# Patient Record
Sex: Female | Born: 1965 | Race: Black or African American | Hispanic: No | Marital: Single | Smoking: Former smoker
Health system: Southern US, Community
[De-identification: ages and names within clinical notes are randomized; demographics above are authoritative.]

## PROBLEM LIST (undated history)

## (undated) DIAGNOSIS — E039 Hypothyroidism, unspecified: Secondary | ICD-10-CM

## (undated) DIAGNOSIS — D649 Anemia, unspecified: Secondary | ICD-10-CM

## (undated) DIAGNOSIS — F329 Major depressive disorder, single episode, unspecified: Secondary | ICD-10-CM

## (undated) DIAGNOSIS — M1990 Unspecified osteoarthritis, unspecified site: Secondary | ICD-10-CM

## (undated) DIAGNOSIS — H539 Unspecified visual disturbance: Secondary | ICD-10-CM

## (undated) DIAGNOSIS — R413 Other amnesia: Secondary | ICD-10-CM

## (undated) DIAGNOSIS — G43909 Migraine, unspecified, not intractable, without status migrainosus: Secondary | ICD-10-CM

## (undated) DIAGNOSIS — F32A Depression, unspecified: Secondary | ICD-10-CM

## (undated) DIAGNOSIS — I256 Silent myocardial ischemia: Secondary | ICD-10-CM

## (undated) HISTORY — DX: Major depressive disorder, single episode, unspecified: F32.9

## (undated) HISTORY — DX: Other amnesia: R41.3

## (undated) HISTORY — DX: Unspecified visual disturbance: H53.9

## (undated) HISTORY — PX: TUBAL LIGATION: SHX77

## (undated) HISTORY — DX: Migraine, unspecified, not intractable, without status migrainosus: G43.909

## (undated) HISTORY — DX: Depression, unspecified: F32.A

---

## 2005-11-03 ENCOUNTER — Inpatient Hospital Stay: Payer: Self-pay | Admitting: Obstetrics and Gynecology

## 2005-11-14 ENCOUNTER — Emergency Department: Payer: Self-pay | Admitting: Emergency Medicine

## 2006-04-08 ENCOUNTER — Other Ambulatory Visit: Payer: Self-pay

## 2006-04-08 ENCOUNTER — Emergency Department: Payer: Self-pay | Admitting: Emergency Medicine

## 2006-04-13 ENCOUNTER — Ambulatory Visit: Payer: Self-pay | Admitting: Emergency Medicine

## 2007-06-15 ENCOUNTER — Ambulatory Visit: Payer: Self-pay | Admitting: Oncology

## 2008-12-21 ENCOUNTER — Ambulatory Visit: Payer: Self-pay | Admitting: Oncology

## 2009-01-02 LAB — CBC WITH DIFFERENTIAL (CANCER CENTER ONLY)
BASO#: 0 10*3/uL (ref 0.0–0.2)
HCT: 30.5 % — ABNORMAL LOW (ref 34.8–46.6)
HGB: 9.6 g/dL — ABNORMAL LOW (ref 11.6–15.9)
LYMPH#: 1.8 10*3/uL (ref 0.9–3.3)
MONO#: 0.2 10*3/uL (ref 0.1–0.9)
NEUT%: 50.8 % (ref 39.6–80.0)
RBC: 4.23 10*6/uL (ref 3.70–5.32)
WBC: 4.4 10*3/uL (ref 3.9–10.0)

## 2009-01-02 LAB — CMP (CANCER CENTER ONLY)
ALT(SGPT): 15 U/L (ref 10–47)
AST: 19 U/L (ref 11–38)
CO2: 27 mEq/L (ref 18–33)
Chloride: 104 mEq/L (ref 98–108)
Creat: 0.8 mg/dl (ref 0.6–1.2)
Sodium: 137 mEq/L (ref 128–145)
Total Bilirubin: 0.6 mg/dl (ref 0.20–1.60)
Total Protein: 8.1 g/dL (ref 6.4–8.1)

## 2009-01-02 LAB — PROTIME-INR (CHCC SATELLITE)
INR: 1.1 — ABNORMAL LOW (ref 2.0–3.5)
Protime: 13.2 Seconds (ref 10.6–13.4)

## 2009-01-04 LAB — RETICULOCYTES (CHCC): ABS Retic: 21.9 10*3/uL (ref 19.0–186.0)

## 2009-01-04 LAB — HEMOGLOBINOPATHY EVALUATION
Hemoglobin Other: 0 % (ref 0.0–0.0)
Hgb A2 Quant: 2.2 % (ref 2.2–3.2)
Hgb F Quant: 0 % (ref 0.0–2.0)

## 2009-01-04 LAB — VON WILLEBRAND PANEL
Factor-VIII Activity: 74 % (ref 50–150)
Ristocetin-Cofactor: 53 % (ref 50–150)

## 2009-01-04 LAB — IRON AND TIBC
%SAT: 5 % — ABNORMAL LOW (ref 20–55)
TIBC: 474 ug/dL — ABNORMAL HIGH (ref 250–470)

## 2009-01-04 LAB — FERRITIN: Ferritin: 5 ng/mL — ABNORMAL LOW (ref 10–291)

## 2009-01-04 LAB — APTT: aPTT: 28 seconds (ref 24–37)

## 2009-01-31 ENCOUNTER — Ambulatory Visit: Payer: Self-pay | Admitting: Oncology

## 2009-02-06 ENCOUNTER — Ambulatory Visit: Payer: Self-pay | Admitting: Oncology

## 2009-04-02 ENCOUNTER — Ambulatory Visit: Payer: Self-pay | Admitting: Oncology

## 2009-04-04 LAB — CBC WITH DIFFERENTIAL (CANCER CENTER ONLY)
BASO%: 0.5 % (ref 0.0–2.0)
HCT: 34.3 % — ABNORMAL LOW (ref 34.8–46.6)
LYMPH#: 1.6 10*3/uL (ref 0.9–3.3)
MONO#: 0.3 10*3/uL (ref 0.1–0.9)
Platelets: 214 10*3/uL (ref 145–400)
RDW: 16.8 % — ABNORMAL HIGH (ref 10.5–14.6)
WBC: 3.7 10*3/uL — ABNORMAL LOW (ref 3.9–10.0)

## 2009-04-04 LAB — IRON AND TIBC
%SAT: 9 % — ABNORMAL LOW (ref 20–55)
Iron: 31 ug/dL — ABNORMAL LOW (ref 42–145)
TIBC: 362 ug/dL (ref 250–470)

## 2009-04-04 LAB — FERRITIN: Ferritin: 20 ng/mL (ref 10–291)

## 2009-10-12 ENCOUNTER — Ambulatory Visit: Payer: Self-pay | Admitting: Oncology

## 2009-10-25 LAB — CMP (CANCER CENTER ONLY)
Albumin: 3.7 g/dL (ref 3.3–5.5)
BUN, Bld: 13 mg/dL (ref 7–22)
Calcium: 8.8 mg/dL (ref 8.0–10.3)
Chloride: 108 mEq/L (ref 98–108)
Glucose, Bld: 87 mg/dL (ref 73–118)
Potassium: 4 mEq/L (ref 3.3–4.7)
Sodium: 139 mEq/L (ref 128–145)
Total Protein: 7.6 g/dL (ref 6.4–8.1)

## 2009-10-25 LAB — IRON AND TIBC
Iron: 120 ug/dL (ref 42–145)
UIBC: 280 ug/dL

## 2009-10-25 LAB — CBC WITH DIFFERENTIAL (CANCER CENTER ONLY)
BASO#: 0 10*3/uL (ref 0.0–0.2)
Eosinophils Absolute: 0.2 10*3/uL (ref 0.0–0.5)
HCT: 35.4 % (ref 34.8–46.6)
MCH: 29.3 pg (ref 26.0–34.0)
MCHC: 32.6 g/dL (ref 32.0–36.0)
MCV: 90 fL (ref 81–101)
MONO#: 0.2 10*3/uL (ref 0.1–0.9)
RDW: 13.2 % (ref 10.5–14.6)

## 2009-11-13 ENCOUNTER — Ambulatory Visit: Payer: Self-pay | Admitting: Oncology

## 2011-04-01 ENCOUNTER — Ambulatory Visit: Payer: Self-pay | Admitting: Internal Medicine

## 2012-10-15 ENCOUNTER — Ambulatory Visit: Payer: Self-pay

## 2012-12-15 IMAGING — NM NM THYROID IMAGING W/ UPTAKE SINGLE (24 HR)
1 series · 3 of 3 positions shown · non-contrast
Comparison: none

REASON FOR EXAM: Hyperthyroidism
COMMENTS:

[Series 1000: (id) thyroid scan · 2.40mm/px · 3 of 3 slices shown]
[im 1/3  full-range]
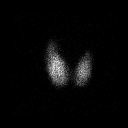
[im 2/3  full-range]
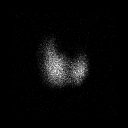
[im 3/3  full-range]
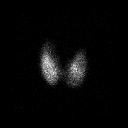

[3 of 3 positions shown; findings below may reference images not displayed]

PROCEDURE:     KNM - KNM THYROID 1-WNM 24HR [DATE]  [DATE]

RESULT:     Following oral administration of 144.9 uCi radioactive Iodine
1-WNM, the 6-hour thyroid uptake measured 78% and the 24-hour uptake
measured 82.2%. These values are in the hyperthyroid range.

Thyroid scan was performed in anterior and both oblique views. The right
lobe is slightly larger than the left. There is homogeneous tracer activity
in both lobes. No hot or cold nodules are identified.
IMPRESSION: 1. Thyroid uptake values are in the hyperthyroid range.
2. Essentially normal thyroid scan. The right lobe is slightly larger than
the left, but no hot or cold nodules are seen on either side.

## 2015-08-28 ENCOUNTER — Other Ambulatory Visit: Payer: Self-pay | Admitting: Internal Medicine

## 2015-08-28 DIAGNOSIS — Z1231 Encounter for screening mammogram for malignant neoplasm of breast: Secondary | ICD-10-CM

## 2015-08-29 ENCOUNTER — Ambulatory Visit
Admission: AD | Admit: 2015-08-29 | Discharge: 2015-08-29 | Disposition: A | Discharge status: Home / Self Care | Payer: Medicaid Other | Source: Ambulatory Visit | Attending: Internal Medicine | Admitting: Internal Medicine

## 2015-08-29 ENCOUNTER — Encounter: Payer: Self-pay | Admitting: *Deleted

## 2015-08-29 DIAGNOSIS — R002 Palpitations: Secondary | ICD-10-CM | POA: Diagnosis not present

## 2015-08-29 DIAGNOSIS — Z8249 Family history of ischemic heart disease and other diseases of the circulatory system: Secondary | ICD-10-CM | POA: Insufficient documentation

## 2015-08-29 DIAGNOSIS — R0602 Shortness of breath: Secondary | ICD-10-CM | POA: Insufficient documentation

## 2015-08-29 DIAGNOSIS — E039 Hypothyroidism, unspecified: Secondary | ICD-10-CM | POA: Insufficient documentation

## 2015-08-29 DIAGNOSIS — D649 Anemia, unspecified: Secondary | ICD-10-CM | POA: Diagnosis present

## 2015-08-29 DIAGNOSIS — D509 Iron deficiency anemia, unspecified: Secondary | ICD-10-CM | POA: Diagnosis present

## 2015-08-29 DIAGNOSIS — F17210 Nicotine dependence, cigarettes, uncomplicated: Secondary | ICD-10-CM | POA: Diagnosis not present

## 2015-08-29 DIAGNOSIS — N920 Excessive and frequent menstruation with regular cycle: Secondary | ICD-10-CM | POA: Diagnosis not present

## 2015-08-29 HISTORY — DX: Anemia, unspecified: D64.9

## 2015-08-29 HISTORY — DX: Hypothyroidism, unspecified: E03.9

## 2015-08-29 LAB — BASIC METABOLIC PANEL
ANION GAP: 5 (ref 5–15)
BUN: 13 mg/dL (ref 6–20)
CALCIUM: 8.8 mg/dL — AB (ref 8.9–10.3)
CHLORIDE: 109 mmol/L (ref 101–111)
CO2: 26 mmol/L (ref 22–32)
CREATININE: 0.64 mg/dL (ref 0.44–1.00)
GFR calc non Af Amer: >60 mL/min (ref >60)
Glucose, Bld: 84 mg/dL (ref 65–99)
Potassium: 3.8 mmol/L (ref 3.5–5.1)
SODIUM: 140 mmol/L (ref 135–145)

## 2015-08-29 LAB — CBC
HEMATOCRIT: 21.6 % — AB (ref 35.0–47.0)
HEMOGLOBIN: 5.9 g/dL — AB (ref 12.0–16.0)
MCH: 16.6 pg — ABNORMAL LOW (ref 26.0–34.0)
MCHC: 27.6 g/dL — ABNORMAL LOW (ref 32.0–36.0)
MCV: 60.1 fL — ABNORMAL LOW (ref 80.0–100.0)
Platelets: 348 10*3/uL (ref 150–440)
RBC: 3.59 MIL/uL — ABNORMAL LOW (ref 3.80–5.20)
RDW: 22 % — ABNORMAL HIGH (ref 11.5–14.5)
WBC: 3.4 10*3/uL — AB (ref 3.6–11.0)

## 2015-08-29 LAB — IRON AND TIBC
IRON: 11 ug/dL — AB (ref 28–170)
SATURATION RATIOS: 2 % — AB (ref 10.4–31.8)
TIBC: 537 ug/dL — AB (ref 250–450)
UIBC: 526 ug/dL

## 2015-08-29 LAB — ABO/RH: ABO/RH(D): B NEG

## 2015-08-29 LAB — TSH: TSH: 36.776 u[IU]/mL — AB (ref 0.350–4.500)

## 2015-08-29 LAB — PREPARE RBC (CROSSMATCH)

## 2015-08-29 LAB — HEMOGLOBIN: HEMOGLOBIN: 8.7 g/dL — AB (ref 12.0–16.0)

## 2015-08-29 MED ORDER — FERROUS SULFATE 325 (65 FE) MG PO TABS: 325.0000 mg | ORAL_TABLET | Freq: Two times a day (BID) | ORAL | Status: DC

## 2015-08-29 MED ORDER — SODIUM CHLORIDE 0.9 % IV SOLN
Freq: Once | INTRAVENOUS | Status: AC
  Administered 2015-08-29: 14:00:00 via INTRAVENOUS

## 2015-08-29 MED ORDER — LEVOTHYROXINE SODIUM 50 MCG PO TABS: 50.0000 ug | ORAL_TABLET | Freq: Every day | ORAL | Status: DC

## 2015-08-29 NOTE — H&P (Signed)
[place] [facility] [facility] [place] NAME: Katherine Brennan    MR#:  [id number]  DATE OF BIRTH:  [birth year]  DATE OF ADMISSION:  08/29/2015  PRIMARY CARE PHYSICIAN: Nino Glow McLean-Scocozza, MD   REQUESTING/REFERRING PHYSICIAN: Dr. Lamonte Sakai  CHIEF COMPLAINT: Low hemoglobin.  HISTORY OF PRESENT ILLNESS: Katherine Brennan  is a 49 y.o. female with a known history of anemia and hypothyroidism- was on medication in past but for last 2 years had not gone to any doctor and not taking any medications. For last 1 year her menstruation cycles had became more heavier and it is running for 5-6 days whereas up until last year her cycles were running only for 1-2 days and they're very light menstruation. She has been increasingly getting more short of breath and tired with exertion. She is able to continue doing her day-to-day work at home but if she has to walk a few more steps outside the house or for shopping and she gets very short of breath and she is to stop and catch her breath. Also once in a while she feels dizzy. Concerned with this she made an appointment with her doctor and yesterday C saw them they've done some blood test and sent her home. Today they got the results back and it was showing her having severe anemia so they called her to go to the hospital for blood transfusion. And called hospitalist team for this direct admission for transfusion. Denies any chest pain.   PAST MEDICAL HISTORY:   Past Medical History  Diagnosis Date  . Hypothyroidism   . Anemia     PAST SURGICAL HISTORY: History reviewed. No pertinent past surgical history.  SOCIAL HISTORY:  Social History  Substance Use Topics  . Smoking status: Light Tobacco Smoker    Types: Cigarettes  . Smokeless tobacco: Not on file  . Alcohol Use: Not on file    FAMILY HISTORY:  Family History  Problem Relation Age of Onset  . CAD Father     DRUG ALLERGIES: No Known Allergies  REVIEW OF  SYSTEMS:   CONSTITUTIONAL: No fever, positive for fatigue or weakness.  EYES: No blurred or double vision.  EARS, NOSE, AND THROAT: No tinnitus or ear pain.  RESPIRATORY: No cough, shortness of breath, wheezing or hemoptysis.  CARDIOVASCULAR: No chest pain, orthopnea, edema.  GASTROINTESTINAL: No nausea, vomiting, diarrhea or abdominal pain.  GENITOURINARY: No dysuria, hematuria.  ENDOCRINE: No polyuria, nocturia,  HEMATOLOGY: No anemia, easy bruising or bleeding SKIN: No rash or lesion. MUSCULOSKELETAL: No joint pain or arthritis.   NEUROLOGIC: No tingling, numbness,  Have generalized weakness.  PSYCHIATRY: No anxiety or depression.   MEDICATIONS AT HOME:  Prior to Admission medications   Not on File      PHYSICAL EXAMINATION:   VITAL SIGNS: Blood pressure 119/74, pulse 74, temperature 98.4 F (36.9 C), temperature source Oral, resp. rate 17, last menstrual period 08/26/2015, SpO2 100 %.  GENERAL:  49 y.o.-year-old patient lying in the bed with no acute distress.  EYES: Pupils equal, round, reactive to light and accommodation. No scleral icterus. Extraocular muscles intact.  HEENT: Head atraumatic, normocephalic. Oropharynx and nasopharynx clear. Conjunctiva pale. NECK:  Supple, no jugular venous distention. No thyroid enlargement, no tenderness.  LUNGS: Normal breath sounds bilaterally, no wheezing, rales,rhonchi or crepitation. No use of accessory muscles of respiration.  CARDIOVASCULAR: S1, S2 normal. No murmurs, rubs, or gallops.  ABDOMEN: Soft, nontender, nondistended. Bowel sounds present. No organomegaly or  mass.  EXTREMITIES: No pedal edema, cyanosis, or clubbing.  NEUROLOGIC: Cranial nerves II through XII are intact. Muscle strength 5/5 in all extremities. Sensation intact. Gait not checked.  PSYCHIATRIC: The patient is alert and oriented x 3.  SKIN: No obvious rash, lesion, or ulcer.   LABORATORY PANEL:   CBC  Recent Labs Lab 08/29/15 1100  WBC 3.4*  HGB 5.9*   HCT 21.6*  PLT 348  MCV 60.1*  MCH 16.6*  MCHC 27.6*  RDW 22.0*   ------------------------------------------------------------------------------------------------------------------  Chemistries   Recent Labs Lab 08/29/15 1100  NA 140  K 3.8  CL 109  CO2 26  GLUCOSE 84  BUN 13  CREATININE 0.64  CALCIUM 8.8*   ------------------------------------------------------------------------------------------------------------------ CrCl cannot be calculated (Unknown ideal weight.). ------------------------------------------------------------------------------------------------------------------ No results for input(s): TSH, T4TOTAL, T3FREE, THYROIDAB in the last 72 hours.  Invalid input(s): FREET3   Coagulation profile No results for input(s): INR, PROTIME in the last 168 hours. ------------------------------------------------------------------------------------------------------------------- No results for input(s): DDIMER in the last 72 hours. -------------------------------------------------------------------------------------------------------------------  Cardiac Enzymes No results for input(s): CKMB, TROPONINI, MYOGLOBIN in the last 168 hours.  Invalid input(s): CK ------------------------------------------------------------------------------------------------------------------ Invalid input(s): POCBNP  ---------------------------------------------------------------------------------------------------------------  Urinalysis No results found for: COLORURINE, APPEARANCEUR, LABSPEC, PHURINE, GLUCOSEU, HGBUR, BILIRUBINUR, KETONESUR, PROTEINUR, UROBILINOGEN, NITRITE, LEUKOCYTESUR   RADIOLOGY: No results found.  IMPRESSION AND PLAN:  * Symptomatic anemia  Microcide take anemia likely secondary to excessive menstruation blood loss.  Currently we will give 2 units of blood transfusion.  discussed with pt about possible side effects of blood transfusion- including more  common like low grade fever to rare and serious like renal and lung involvement and infections.  Pt understood- and due to necessity of transfusion- agreed to receive the transfusion.  I advised her to follow-up with her primary doctor in next few days and get a referral to GYN doctor to address this issue.  Meanwhile she will need to be discharged with oral iron therapy.  * Hypothyroidism  Patient says she was tested and was on some medications 2 years ago, but then since stopped taking everything and not going to the doctor. I'll check her TSH level and we will decide accordingly.   * Smoking  Tobacco cessation counseling was done 4 minutes.   All the records are reviewed and case discussed with ED provider. Management plans discussed with the patient, family and they are in agreement.  CODE STATUS:    Code Status Orders        Start     Ordered   08/29/15 1101  Full code   Continuous     08/29/15 1100       TOTAL TIME TAKING CARE OF THIS PATIENT: 50 minutes.    Vaughan Basta M.D on 08/29/2015   Between 7am to 6pm - Pager - [phone]  After 6pm go to [email or web address] - password [email or web address] [facility]  Office  [phone]  CC: Primary care physician; Nino Glow McLean-Scocozza, MD   Note: This dictation was prepared with Dragon dictation along with smaller phrase technology. Any transcriptional errors that result from this process are unintentional.

## 2015-08-29 NOTE — Progress Notes (Signed)
Patient's Hgb is 8.7, Vitals are WNL, no c/o SOB, pain or any discomfort. MD, Jannifer Franklin aware. MD will put in the order to discharge patient.

## 2015-08-29 NOTE — Discharge Instructions (Signed)
Follow with PMD in 1-2 weeks. °

## 2015-08-29 NOTE — Progress Notes (Signed)
MD paged about pts D/C for tonight. Ok to D/C pt if Hgb >7 post transfusion of 2units packed rbc.

## 2015-08-29 NOTE — Discharge Summary (Signed)
[facility] at [place] NAME: Katherine Brennan    MR#:  [id number]  DATE OF BIRTH:  Sep 05, 1966  DATE OF ADMISSION:  08/29/2015 ADMITTING PHYSICIAN: Loletha Grayer, MD  DATE OF DISCHARGE: 08/29/15  PRIMARY CARE PHYSICIAN: Nino Glow McLean-Scocozza, MD    ADMISSION DIAGNOSIS:  Anemia SOB Palpitations  DISCHARGE DIAGNOSIS:  Active Problems:   Symptomatic anemia   SECONDARY DIAGNOSIS:   Past Medical History  Diagnosis Date  . Hypothyroidism   . Anemia     HOSPITAL COURSE:  * Symptomatic anemia Microcide take anemia likely secondary to excessive menstruation blood loss.  given 2 units of blood transfusion. I advised her to follow-up with her primary doctor in next few days and get a referral to GYN doctor to address this issue. Meanwhile she will need to be discharged with oral iron therapy.  * Hypothyroidism Patient says she was tested and was on some medications 2 years ago, but then since stopped taking everything and not going to the doctor. TSH is 36- start on synthroid 50 mcg- and follow with PMD.    DISCHARGE CONDITIONS:   Stable.  CONSULTS OBTAINED:     DRUG ALLERGIES:  No Known Allergies  DISCHARGE MEDICATIONS:   Current Discharge Medication List    START taking these medications   Details  ferrous sulfate 325 (65 FE) MG tablet Take 1 tablet (325 mg total) by mouth 2 (two) times daily with a meal. Qty: 60 tablet, Refills: 0    levothyroxine (SYNTHROID) 50 MCG tablet Take 1 tablet (50 mcg total) by mouth daily before breakfast. Qty: 30 tablet, Refills: 0         DISCHARGE INSTRUCTIONS:    Follow with PMD in 1 week,. If you experience worsening of your admission symptoms, develop shortness of breath, life threatening emergency, suicidal or homicidal thoughts you must seek medical attention immediately by calling 911 or calling your MD immediately  if symptoms less severe.  You Must read  complete instructions/literature along with all the possible adverse reactions/side effects for all the Medicines you take and that have been prescribed to you. Take any new Medicines after you have completely understood and accept all the possible adverse reactions/side effects.   Please note  You were cared for by a hospitalist during your hospital stay. If you have any questions about your discharge medications or the care you received while you were in the hospital after you are discharged, you can call the unit and asked to speak with the hospitalist on call if the hospitalist that took care of you is not available. Once you are discharged, your primary care physician will handle any further medical issues. Please note that NO REFILLS for any discharge medications will be authorized once you are discharged, as it is imperative that you return to your primary care physician (or establish a relationship with a primary care physician if you do not have one) for your aftercare needs so that they can reassess your need for medications and monitor your lab values.    Today   CHIEF COMPLAINT:  No chief complaint on file.   HISTORY OF PRESENT ILLNESS:  Katherine Brennan  is a 49 y.o. female with a known history of anemia and hypothyroidism- was on medication in past but for last 2 years had not gone to any doctor and not taking any medications. For last 1 year her menstruation cycles had became more heavier and it is running for 5-6  days whereas up until last year her cycles were running only for 1-2 days and they're very light menstruation. She has been increasingly getting more short of breath and tired with exertion. She is able to continue doing her day-to-day work at home but if she has to walk a few more steps outside the house or for shopping and she gets very short of breath and she is to stop and catch her breath. Also once in a while she feels dizzy. Concerned with this she made an appointment with  her doctor and yesterday C saw them they've done some blood test and sent her home. Today they got the results back and it was showing her having severe anemia so they called her to go to the hospital for blood transfusion. And called hospitalist team for this direct admission for transfusion. Denies any chest pain.  VITAL SIGNS:  Blood pressure 118/81, pulse 71, temperature 98.4 F (36.9 C), temperature source Oral, resp. rate 17, last menstrual period 08/26/2015, SpO2 100 %.  I/O:   Intake/Output Summary (Last 24 hours) at 08/29/15 1850 Last data filed at 08/29/15 1700  Gross per 24 hour  Intake    800 ml  Output      0 ml  Net    800 ml    PHYSICAL EXAMINATION:   GENERAL: 49 y.o.-year-old patient lying in the bed with no acute distress.  EYES: Pupils equal, round, reactive to light and accommodation. No scleral icterus. Extraocular muscles intact.  HEENT: Head atraumatic, normocephalic. Oropharynx and nasopharynx clear. Conjunctiva pale. NECK: Supple, no jugular venous distention. No thyroid enlargement, no tenderness.  LUNGS: Normal breath sounds bilaterally, no wheezing, rales,rhonchi or crepitation. No use of accessory muscles of respiration.  CARDIOVASCULAR: S1, S2 normal. No murmurs, rubs, or gallops.  ABDOMEN: Soft, nontender, nondistended. Bowel sounds present. No organomegaly or mass.  EXTREMITIES: No pedal edema, cyanosis, or clubbing.  NEUROLOGIC: Cranial nerves II through XII are intact. Muscle strength 5/5 in all extremities. Sensation intact. Gait not checked.  PSYCHIATRIC: The patient is alert and oriented x 3.  SKIN: No obvious rash, lesion, or ulcer.    DATA REVIEW:   CBC  Recent Labs Lab 08/29/15 1100  WBC 3.4*  HGB 5.9*  HCT 21.6*  PLT 348    Chemistries   Recent Labs Lab 08/29/15 1100  NA 140  K 3.8  CL 109  CO2 26  GLUCOSE 84  BUN 13  CREATININE 0.64  CALCIUM 8.8*    Cardiac Enzymes No results for input(s): TROPONINI in the  last 168 hours.  Microbiology Results  No results found for this or any previous visit.  RADIOLOGY:  No results found.  EKG:   Orders placed or performed in visit on 04/08/06  . EKG 12-Lead      Management plans discussed with the patient, family and they are in agreement.  CODE STATUS:     Code Status Orders        Start     Ordered   08/29/15 1101  Full code   Continuous     08/29/15 1100      TOTAL TIME TAKING CARE OF THIS PATIENT: 35 minutes.    Vaughan Basta M.D on 08/29/2015 at 6:50 PM  Between 7am to 6pm - Pager - [phone]  After 6pm go to [email or web address] - password [email or web address] [facility]  Office  [phone]  CC: Primary care physician; Nino Glow McLean-Scocozza, MD   Note: This dictation was prepared  with Dragon dictation along with smaller phrase technology. Any transcriptional errors that result from this process are unintentional.

## 2015-08-30 LAB — TYPE AND SCREEN
ABO/RH(D): B NEG
ANTIBODY SCREEN: NEGATIVE
UNIT DIVISION: 0
UNIT DIVISION: 0

## 2015-09-06 ENCOUNTER — Ambulatory Visit
Admission: RE | Admit: 2015-09-06 | Discharge: 2015-09-06 | Disposition: A | Discharge status: Home / Self Care | Payer: Medicaid Other | Source: Ambulatory Visit | Attending: Internal Medicine | Admitting: Internal Medicine

## 2015-09-06 DIAGNOSIS — Z1231 Encounter for screening mammogram for malignant neoplasm of breast: Secondary | ICD-10-CM | POA: Insufficient documentation

## 2015-09-27 DIAGNOSIS — R768 Other specified abnormal immunological findings in serum: Secondary | ICD-10-CM | POA: Insufficient documentation

## 2015-09-27 DIAGNOSIS — R0609 Other forms of dyspnea: Secondary | ICD-10-CM

## 2015-09-27 DIAGNOSIS — R7689 Other specified abnormal immunological findings in serum: Secondary | ICD-10-CM | POA: Insufficient documentation

## 2015-09-27 DIAGNOSIS — M2550 Pain in unspecified joint: Secondary | ICD-10-CM | POA: Insufficient documentation

## 2015-09-27 DIAGNOSIS — R7982 Elevated C-reactive protein (CRP): Secondary | ICD-10-CM | POA: Insufficient documentation

## 2016-05-15 DIAGNOSIS — E039 Hypothyroidism, unspecified: Secondary | ICD-10-CM | POA: Insufficient documentation

## 2016-05-15 DIAGNOSIS — Z77120 Contact with and (suspected) exposure to mold (toxic): Secondary | ICD-10-CM | POA: Insufficient documentation

## 2016-05-15 DIAGNOSIS — E049 Nontoxic goiter, unspecified: Secondary | ICD-10-CM | POA: Insufficient documentation

## 2016-08-04 ENCOUNTER — Other Ambulatory Visit: Payer: Self-pay | Admitting: Internal Medicine

## 2016-08-04 ENCOUNTER — Ambulatory Visit
Admission: RE | Admit: 2016-08-04 | Discharge: 2016-08-04 | Disposition: A | Discharge status: Home / Self Care | Payer: Disability Insurance | Source: Ambulatory Visit | Attending: Internal Medicine | Admitting: Internal Medicine

## 2016-08-04 DIAGNOSIS — S3992XA Unspecified injury of lower back, initial encounter: Secondary | ICD-10-CM

## 2016-08-04 DIAGNOSIS — M2480 Other specific joint derangements of unspecified joint, not elsewhere classified: Secondary | ICD-10-CM | POA: Insufficient documentation

## 2016-08-04 DIAGNOSIS — X58XXXA Exposure to other specified factors, initial encounter: Secondary | ICD-10-CM | POA: Insufficient documentation

## 2016-10-14 DIAGNOSIS — R200 Anesthesia of skin: Secondary | ICD-10-CM | POA: Insufficient documentation

## 2016-10-14 DIAGNOSIS — G8929 Other chronic pain: Secondary | ICD-10-CM | POA: Insufficient documentation

## 2016-10-14 DIAGNOSIS — M25562 Pain in left knee: Secondary | ICD-10-CM

## 2016-10-15 ENCOUNTER — Other Ambulatory Visit: Payer: Self-pay | Admitting: Internal Medicine

## 2016-10-15 DIAGNOSIS — M25562 Pain in left knee: Secondary | ICD-10-CM

## 2016-10-23 ENCOUNTER — Ambulatory Visit
Admission: RE | Admit: 2016-10-23 | Discharge: 2016-10-23 | Disposition: A | Discharge status: Home / Self Care | Payer: Medicaid Other | Source: Ambulatory Visit | Attending: Internal Medicine | Admitting: Internal Medicine

## 2017-03-25 ENCOUNTER — Other Ambulatory Visit: Payer: Self-pay | Admitting: Family Medicine

## 2017-03-25 DIAGNOSIS — Z1239 Encounter for other screening for malignant neoplasm of breast: Secondary | ICD-10-CM

## 2017-04-15 ENCOUNTER — Inpatient Hospital Stay: Payer: Medicaid Other

## 2017-04-15 ENCOUNTER — Encounter: Payer: Self-pay | Admitting: Hematology and Oncology

## 2017-04-15 ENCOUNTER — Inpatient Hospital Stay: Payer: Medicaid Other | Attending: Hematology and Oncology | Admitting: Hematology and Oncology

## 2017-04-15 VITALS — BP 106/74 | HR 84 | Temp 97.8°F | Resp 18 | Ht 65.0 in | Wt 197.5 lb

## 2017-04-15 DIAGNOSIS — H539 Unspecified visual disturbance: Secondary | ICD-10-CM | POA: Diagnosis not present

## 2017-04-15 DIAGNOSIS — Z79899 Other long term (current) drug therapy: Secondary | ICD-10-CM | POA: Diagnosis not present

## 2017-04-15 DIAGNOSIS — D472 Monoclonal gammopathy: Secondary | ICD-10-CM | POA: Diagnosis not present

## 2017-04-15 DIAGNOSIS — F17210 Nicotine dependence, cigarettes, uncomplicated: Secondary | ICD-10-CM | POA: Insufficient documentation

## 2017-04-15 DIAGNOSIS — F413 Other mixed anxiety disorders: Secondary | ICD-10-CM | POA: Diagnosis not present

## 2017-04-15 DIAGNOSIS — R413 Other amnesia: Secondary | ICD-10-CM | POA: Diagnosis not present

## 2017-04-15 DIAGNOSIS — R233 Spontaneous ecchymoses: Secondary | ICD-10-CM

## 2017-04-15 DIAGNOSIS — E039 Hypothyroidism, unspecified: Secondary | ICD-10-CM | POA: Diagnosis not present

## 2017-04-15 DIAGNOSIS — R238 Other skin changes: Secondary | ICD-10-CM | POA: Insufficient documentation

## 2017-04-15 DIAGNOSIS — N920 Excessive and frequent menstruation with regular cycle: Secondary | ICD-10-CM | POA: Diagnosis not present

## 2017-04-15 DIAGNOSIS — D649 Anemia, unspecified: Secondary | ICD-10-CM

## 2017-04-15 DIAGNOSIS — F329 Major depressive disorder, single episode, unspecified: Secondary | ICD-10-CM | POA: Diagnosis not present

## 2017-04-15 DIAGNOSIS — R0602 Shortness of breath: Secondary | ICD-10-CM | POA: Diagnosis not present

## 2017-04-15 DIAGNOSIS — R42 Dizziness and giddiness: Secondary | ICD-10-CM

## 2017-04-15 DIAGNOSIS — R778 Other specified abnormalities of plasma proteins: Secondary | ICD-10-CM

## 2017-04-15 DIAGNOSIS — D509 Iron deficiency anemia, unspecified: Secondary | ICD-10-CM | POA: Diagnosis present

## 2017-04-15 LAB — CBC WITH DIFFERENTIAL/PLATELET
Basophils Absolute: 0 10*3/uL (ref 0–0.1)
Basophils Relative: 1 %
Eosinophils Absolute: 0.1 10*3/uL (ref 0–0.7)
Eosinophils Relative: 2 %
HCT: 28.6 % — ABNORMAL LOW (ref 35.0–47.0)
Hemoglobin: 9 g/dL — ABNORMAL LOW (ref 12.0–16.0)
Lymphocytes Relative: 37 %
Lymphs Abs: 1.6 10*3/uL (ref 1.0–3.6)
MCH: 25.2 pg — ABNORMAL LOW (ref 26.0–34.0)
MCHC: 31.6 g/dL — ABNORMAL LOW (ref 32.0–36.0)
MCV: 79.7 fL — ABNORMAL LOW (ref 80.0–100.0)
Monocytes Absolute: 0.3 10*3/uL (ref 0.2–0.9)
Monocytes Relative: 8 %
Neutro Abs: 2.3 10*3/uL (ref 1.4–6.5)
Neutrophils Relative %: 52 %
Platelets: 245 10*3/uL (ref 150–440)
RBC: 3.59 MIL/uL — ABNORMAL LOW (ref 3.80–5.20)
RDW: 17.5 % — ABNORMAL HIGH (ref 11.5–14.5)
WBC: 4.4 10*3/uL (ref 3.6–11.0)

## 2017-04-15 LAB — PLATELET FUNCTION ASSAY: Collagen / Epinephrine: 95 seconds (ref 0–193)

## 2017-04-15 LAB — PROTIME-INR
INR: 1
Prothrombin Time: 13.2 seconds (ref 11.4–15.2)

## 2017-04-15 LAB — FERRITIN: Ferritin: 4 ng/mL — ABNORMAL LOW (ref 11–307)

## 2017-04-15 NOTE — Progress Notes (Signed)
1 

## 2017-04-15 NOTE — Progress Notes (Signed)
[facility] day:  04/15/2017  Chief Complaint: Katherine Brennan is a 51 y.o. female with iron deficiency anemia who is referred in consultation by Dr. Salome Holmes for assessment and management.  HPI:  The patient notes a history of anemia for 2-3 years. She denies any melena or hematochezia. She has had no prior colonoscopy. She states her menses has been very heavy for 1 year. Menses last 7-8 days. She uses 24 tampons with pads. Sometimes she can become lightheaded and dizzy. She has seen gynecology in the past.  On 08/29/2015, she was admitted to [facility] with symptomatic anemia felt secondary to menorrhagia.  CBC revealed a hematocrit of 21.6, hemoglobin 5.9 and MCV 60.1. She was transfused with 2 units of PRBCs.  She was discharged on oral iron..  CBC on 09/27/2015 revealed a hematocrit 32.6, hemoglobin 9.3 and MCV 75.1. Ferritin was 5 with an iron saturation of 5% and a TIBC of 597.  Hemoglobin electrophoresis on 05/14/2016 was normal.  SPEP on 09/27/2015 revealed an M spike of 0.5 g/dL. Immunofixation revealed an IgG monoclonal protein with kappa light chain specificity.  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  IgG was 1901 [phone]) and IgA 514 ([phone]).  She has been on oral iron for about a year.  Ferrous sulfate was increased to 325 mg twice a day. She takes it with orange juice or vitamin C. Iron causes constipation. She received IV iron about 2 years ago at [facility]. She states infusions last for about 4 months and helped.   CBC on 03/25/2017 revealed a hematocrit of 31.6, hemoglobin 9.7, MCV 83, platelets 272,000 and white count 3600.  Creatinine was 0.7.  Ferritin was 5. Iron saturation was 4% and TIBC of 568.  TSH was 96.79 (high) with a free T4 of 0.45 (low).  Regarding her diet, she eats meat daily. She eats leafy vegetables 3 times a week. Ice pica resolved after being  on oral iron for 1 year. Previously she ate bags of ice. She notes unexplained bruising.  She denies any excess bleeding with surgery. She denies any use of aspirin or ibuprofen.  She has no family history of a bleeding diathesis.  Symptomatically, she feels pretty good. She does get tired walking to her car. She has shortness of breath with exertion. She denies any bone pain. She denies any issues with recurrent infections. He has had thyroid issues for approximately 3 years.    Past Medical History:  Diagnosis Date  . Anemia   . Depression   . Hypothyroidism   . Memory changes   . Migraines   . Vision changes     Past Surgical History:  Procedure Laterality Date  . [place], 2007    Family History  Problem Relation Age of Onset  . CAD Father   . Breast cancer Neg Hx     Social History:  reports that she has been smoking Cigarettes.  She does not have any smokeless tobacco history on file. Her alcohol and drug histories are not on file.  She drinks alcohol socially.  She is divorced.  She lives in [place].  The patient is alone today.  Allergies: No Known Allergies  Current Medications: Current Outpatient Prescriptions  Medication Sig Dispense Refill  . albuterol (PROVENTIL HFA;VENTOLIN HFA) 108 (90 Base) MCG/ACT inhaler Inhale 2 puffs into the lungs every 6 (six) hours as  needed.    . cetirizine (ZYRTEC) 10 MG tablet Take 10 mg by mouth daily.    . enalapril (VASOTEC) 2.5 MG tablet Take 2.5 mg by mouth daily.    . ferrous sulfate 325 (65 FE) MG tablet Take 1 tablet (325 mg total) by mouth 2 (two) times daily with a meal. 60 tablet 0  . levothyroxine (SYNTHROID) 50 MCG tablet Take 1 tablet (50 mcg total) by mouth daily before breakfast. 30 tablet 0  . levothyroxine (SYNTHROID, LEVOTHROID) 125 MCG tablet Take 125 mcg by mouth daily.    . meclizine (ANTIVERT) 12.5 MG tablet Take 12.5 mg by mouth 3 (three) times daily.    Marland Kitchen topiramate (TOPAMAX) 25 MG tablet  Take 25 mg by mouth 2 (two) times daily.     No current facility-administered medications for this visit.     Review of Systems:  GENERAL:  Feels "pretty good".  No fevers, sweats or weight loss. PERFORMANCE STATUS (ECOG):  1 HEENT:  Mole behind left eye (eye weak; sees dots).  No runny nose, sore throat, mouth sores or tenderness. Lungs: Shortness of breath with exertion.  No cough.  No hemoptysis. Cardiac:  No chest pain, palpitations, orthopnea, or PND. GI:  No nausea, vomiting, diarrhea, constipation, melena or hematochezia. GU:  Trouble holding urine s/p 3 C sections.  No urgency, frequency, dysuria, or hematuria. Musculoskeletal:  Legs hurt (left knee and hips hurt if sits too long".  No back pain.  No joint pain.  No muscle tenderness. Extremities:  No pain or swelling. Skin:  Unexplained bruise on shin.  No rashes or skin changes. Neuro:  Migraine headaches.  No numbness or weakness, balance or coordination issues. Endocrine:  No diabetes.  Hypothyroid on Synthroid.  No hot flashes or night sweats. Psych:  No mood changes, depression or anxiety. Pain:  No focal pain. Review of systems:  All other systems reviewed and found to be negative.  Physical Exam: Blood pressure 106/74, pulse 84, temperature 97.8 F (36.6 C), temperature source Tympanic, resp. rate 18, height 5\' 5"  (1.651 m), weight 197 lb 8.5 oz (89.6 kg). GENERAL:  Well developed, well nourished, sitting comfortably in the exam room in no acute distress. MENTAL STATUS:  Alert and oriented to person, place and time. HEAD:  Normocephalic, atraumatic, face symmetric, no Cushingoid features. EYES:  Pupils equal round and reactive to light and accomodation.  No conjunctivitis or scleral icterus. ENT: Right upper lip piercing.  Oropharynx clear without lesion.  Tongue normal. Mucous membranes moist.  RESPIRATORY:  Clear to auscultation without rales, wheezes or rhonchi. CARDIOVASCULAR:  Regular rate and rhythm without  murmur, rub or gallop. ABDOMEN:  Soft, non-tender, with active bowel sounds, and no hepatosplenomegaly.  No masses. SKIN:  Tattoo.  No petechiae.  No rashes, ulcers or lesions. EXTREMITIES: No edema, no skin discoloration or tenderness.  No palpable cords. LYMPH NODES: No palpable cervical, supraclavicular, axillary or inguinal adenopathy  NEUROLOGICAL: Unremarkable. PSYCH:  Appropriate.   Office Visit on 04/15/2017  Component Date Value Ref Range Status  . WBC 04/15/2017 4.4  3.6 - 11.0 K/uL Final  . RBC 04/15/2017 3.59* 3.80 - 5.20 MIL/uL Final  . Hemoglobin 04/15/2017 9.0* 12.0 - 16.0 g/dL Final  . HCT 04/15/2017 28.6* 35.0 - 47.0 % Final  . MCV 04/15/2017 79.7* 80.0 - 100.0 fL Final  . MCH 04/15/2017 25.2* 26.0 - 34.0 pg Final  . MCHC 04/15/2017 31.6* 32.0 - 36.0 g/dL Final  . RDW 04/15/2017 17.5* 11.5 -  14.5 % Final  . Platelets 04/15/2017 245  150 - 440 K/uL Final  . Neutrophils Relative % 04/15/2017 52  % Final  . Neutro Abs 04/15/2017 2.3  1.4 - 6.5 K/uL Final  . Lymphocytes Relative 04/15/2017 37  % Final  . Lymphs Abs 04/15/2017 1.6  1.0 - 3.6 K/uL Final  . Monocytes Relative 04/15/2017 8  % Final  . Monocytes Absolute 04/15/2017 0.3  0.2 - 0.9 K/uL Final  . Eosinophils Relative 04/15/2017 2  % Final  . Eosinophils Absolute 04/15/2017 0.1  0 - 0.7 K/uL Final  . Basophils Relative 04/15/2017 1  % Final  . Basophils Absolute 04/15/2017 0.0  0 - 0.1 K/uL Final  . Ferritin 04/15/2017 4* 11 - 307 ng/mL Final  . Prothrombin Time 04/15/2017 13.2  11.4 - 15.2 seconds Final  . INR 04/15/2017 1.00   Final  . Coagulation Factor VIII 04/15/2017 209* 57 - 163 % Final   Comment: (NOTE) FVIII activity can increase in a variety of clinical situations including normal pregnancy, in samples drawn from patients (particularly children) who are visibly stressed at the time of phlebotomy, as acute phase reactants, or in response to certain drug therapies such as DDAVP.  Persistently  elevated FVIII activity is a risk factor for venous thrombosis as well as recurrence of venous thrombosis.  Risk is graded and increases with the degree of elevation.  Although elevated FVIII activity has been identified to cluster within families, a genetic basis for the elevation has not yet been elucidated (Br J Haematol. 2012; 662:947-654).   . Ristocetin Co-factor, Plasma 04/15/2017 65  50 - 200 % Final   Comment: (NOTE) Performed At: [facility] [place], [place] [id number] Lindon Romp MD CL:[id number]   . Von Willebrand Antigen, Plasma 04/15/2017 136  50 - 200 % Final   Comment: (NOTE) This test was developed and its performance characteristics determined by [facility]. It has not been cleared or approved by the Food and Drug Administration.   Marland Kitchen PFA Interpretation 04/15/2017          Final   Comment: Platelet function is normal. If patient history/physical examination give strong indication of a bleeding disorder repeat testing for confirmation.        Results of the test should always be interpreted in conjunction with the patient's medical history, clinical presentation and medication history. Patients with Hematocrit values <35.0% or Platelet counts <150,000/uL may result in values above the Laboratory established reference range.   . Collagen / Epinephrine 04/15/2017 95  0 - 193 seconds Final   Performed at [facility], [address], [place], [place] [address]  . IgG (Immunoglobin G), Serum 04/15/2017 1713* 700 - 1,600 mg/dL Final  . IgA 04/15/2017 453* 87 - 352 mg/dL Final  . IgM, Serum 04/15/2017 70  26 - 217 mg/dL Final  . Total Protein ELP 04/15/2017 7.4  6.0 - 8.5 g/dL Corrected  . Albumin SerPl Elph-Mcnc 04/15/2017 3.6  2.9 - 4.4 g/dL Corrected  . Alpha 1 04/15/2017 0.2  0.0 - 0.4 g/dL Corrected  . Alpha2 Glob SerPl Elph-Mcnc 04/15/2017 0.6  0.4 - 1.0 g/dL Corrected  . B-Globulin SerPl Elph-Mcnc 04/15/2017 1.3  0.7 - 1.3 g/dL  Corrected  . Gamma Glob SerPl Elph-Mcnc 04/15/2017 1.7  0.4 - 1.8 g/dL Corrected  . M Protein SerPl Elph-Mcnc 04/15/2017 0.5* Not Observed g/dL Corrected  . Globulin, Total 04/15/2017 3.8  2.2 - 3.9 g/dL Corrected  . Albumin/Glob SerPl 04/15/2017  1.0  0.7 - 1.7 Corrected  . IFE 1 04/15/2017 Comment   Corrected   Comment: (NOTE) Immunofixation shows IgG monoclonal protein with kappa light chain specificity. An apparent polyclonal gammopathy: IgA. Kappa and lambda typing appear increased.   . Please Note 04/15/2017 Comment   Corrected   Comment: (NOTE) Protein electrophoresis scan will follow via computer, mail, or courier delivery. Performed At: [facility] [place], [place] [id number] Lindon Romp MD IN:[id number]   . Kappa free light chain 04/15/2017 30.4* 3.3 - 19.4 mg/L Final  . Lamda free light chains 04/15/2017 17.8  5.7 - 26.3 mg/L Final  . Kappa, lamda light chain ratio 04/15/2017 1.71* 0.26 - 1.65 Final   Comment: (NOTE) Performed At: [facility] [place], [place] [id number] Lindon Romp MD HU:[id number]   . Interpretation 04/15/2017 Note   Final   Comment: (NOTE) ------------------------------- COAGULATION: VON WILLEBRAND FACTOR ASSESSMENT CURRENT RESULTS ASSESSMENT The VWF:Ag is normal. The VWF:RCo is normal. The FVIII is elevated. VON WILLEBRAND FACTOR ASSESSMENT CURRENT RESULTS INTERPRETATION - These results are not consistent with a diagnosis of VWD according to the current NHLBI guideline. Persistently elevated FVIII activity is a risk factor for venous thrombosis as well as recurrence of venous thrombosis. Risk is graded and increases with the degree of elevation. Although elevated FVIII activity has been identified to cluster within families, a genetic basis for the elevation has not yet been elucidated (Br J Haematol. 2012; 157(6):653-663). VON WILLEBRAND FACTOR ASSESSMENT - Results may be falsely  elevated and possibly falsely normal as VWF and FVIII may increase in pregnancy, in samples drawn from patients (particularly children) who are visibly stressed at the time of phlebotomy, as acute phase reactants                          , or in response to certain drug therapies such as desmopressin. Repeat testing may be necessary before excluding a diagnosis of VWD especially if the clinical suspicion is high for an underlying bleeding disorder. The setting for phlebotomy should be as calm as possible and patients should be encouraged to sit quietly prior to the blood draw. VON WILLEBRAND FACTOR ASSESSMENT DEFINITIONS - VWD - von Willebrand disease; VWF - von Willebrand factor; VWF:Ag - VWF antigen; VWF:RCo - VWF ristocetin cofactor activity; FVIII - factor VIII activity. - MEDICAL DIRECTOR: For questions regarding panel interpretation, please contact Jake Bathe, M.D. at [facility] at [phone]. ------------------------------- DISCLAIMER These assessments and interpretations are provided as a convenience in support of the physician-patient relationship and are not intended to replace the physician's clinical judgment. They are derived from national guidelines in addit                          ion to other evidence and expert opinion. The clinician should consider this information within the context of clinical opinion and the individual patient. SEE GUIDANCE FOR VON WILLEBRAND FACTOR ASSESSMENT: (1) The National Heart, Lung and Blood Institute. The Diagnosis, Evaluation and Management of von Willebrand Disease. Janeal Holmes, MD: [employer] Publication 10-7492. 4967. Available at vSpecials.com.pt. (2) Daryl Eastern et al. Carmin Muskrat J Hematol. 2009; 84(6):366-370. (3) [place]. 2004;10(3):199-217. (4) Pasi KJ et al. Haemophilia. 2004; 10(3):218-231. Performed At: [facility] [place], [place] [id number] Thomasene Ripple MD ZL:[id number]     Assessment:  Katherine Cirilo  Brennan is a 51 y.o. female with iron deficiency anemia secondary to menorrhagia.  She denies any melena, hematochezia or hematuria.  Diet is good.  She has been on oral iron BID x 1 year.  She has a history of ice pica.  She received 2 units of PRBCs on 08/29/2015 for menorrhagia.  She has received IV iron 2 years ago at [facility].  CBC on 03/25/2017 revealed a hematocrit of 31.6, hemoglobin 9.7, and MCV 83.  Ferritin was 5. Iron saturation was 4% and TIBC of 568.  She has a history of a monoclonal gammopathy.  SPEP on 09/27/2015 revealed an M spike of 0.5 g/dL. Immunofixation revealed an IgG monoclonal protein with kappa light chain specificity.  IgG was 1901 [phone]) and IgA 514 (87-352).  She bruises easily.  She denies any excess bleeding with surgeries.  She does not take aspirin or ibuprofen.  She denies any family history of bleeding.  Symptomatically, she has shortness of breath with exertion.  Exam is unremarkable.  Plan: 1.  Discuss diagnosis of iron deficiency anemia due to menorrhagia.  Discuss work-up for bleeding diathesis.  Discuss low ferritin despite 1 year of oral iron.  Discuss reinstitution of IV iron.  Goal ferritin is 100. 2.  Discuss monoclonal gammopathy.  She denies any bone pain or recurrent infections.  Discuss rechecking SPEP.  If monoclonal gammopathy persists, discuss further work-up. 3.  Labs today: CBC with diff, ferritin, PT/INR, von Willebrand panel, platelet function assay, SPEP with immunoglobulins, free light chain assay. 4.  Guaiac cards x3. 5.  Preauth Venofer 6.  RTC in 1 week for review of labs and +/- Venofer   Lequita Asal, MD  04/15/2017, 3:42 PM

## 2017-04-15 NOTE — Progress Notes (Signed)
Patient here today as new evaluation regarding IDA/Hgb 9.7.  Referred by Dr. Iona Beard.

## 2017-04-17 LAB — VON WILLEBRAND PANEL
Coagulation Factor VIII: 209 % — ABNORMAL HIGH (ref 57–163)
Ristocetin Co-factor, Plasma: 65 % (ref 50–200)
Von Willebrand Antigen, Plasma: 136 % (ref 50–200)

## 2017-04-17 LAB — KAPPA/LAMBDA LIGHT CHAINS
Kappa free light chain: 30.4 mg/L — ABNORMAL HIGH (ref 3.3–19.4)
Kappa, lambda light chain ratio: 1.71 — ABNORMAL HIGH (ref 0.26–1.65)
Lambda free light chains: 17.8 mg/L (ref 5.7–26.3)

## 2017-04-17 LAB — COAG STUDIES INTERP REPORT

## 2017-04-18 LAB — MULTIPLE MYELOMA PANEL, SERUM
Albumin SerPl Elph-Mcnc: 3.6 g/dL (ref 2.9–4.4)
Albumin/Glob SerPl: 1 (ref 0.7–1.7)
Alpha 1: 0.2 g/dL (ref 0.0–0.4)
Alpha2 Glob SerPl Elph-Mcnc: 0.6 g/dL (ref 0.4–1.0)
B-Globulin SerPl Elph-Mcnc: 1.3 g/dL (ref 0.7–1.3)
Gamma Glob SerPl Elph-Mcnc: 1.7 g/dL (ref 0.4–1.8)
Globulin, Total: 3.8 g/dL (ref 2.2–3.9)
IgA: 453 mg/dL — ABNORMAL HIGH (ref 87–352)
IgG (Immunoglobin G), Serum: 1713 mg/dL — ABNORMAL HIGH (ref 700–1600)
IgM, Serum: 70 mg/dL (ref 26–217)
M Protein SerPl Elph-Mcnc: 0.5 g/dL — ABNORMAL HIGH
Total Protein ELP: 7.4 g/dL (ref 6.0–8.5)

## 2017-04-22 ENCOUNTER — Inpatient Hospital Stay: Payer: Medicaid Other

## 2017-04-22 ENCOUNTER — Inpatient Hospital Stay: Payer: Medicaid Other | Admitting: Hematology and Oncology

## 2017-04-22 ENCOUNTER — Encounter: Payer: Self-pay | Admitting: Hematology and Oncology

## 2017-04-22 DIAGNOSIS — D472 Monoclonal gammopathy: Secondary | ICD-10-CM | POA: Insufficient documentation

## 2017-04-22 DIAGNOSIS — D509 Iron deficiency anemia, unspecified: Secondary | ICD-10-CM | POA: Diagnosis not present

## 2017-04-22 NOTE — Progress Notes (Deleted)
[facility] day:  04/22/2017   Chief Complaint: Katherine Brennan is a 51 y.o. female with iron deficiency anemia who is seen for review of work-up and discussion regarding direction of therapy.  HPI:  The patient was last seen in the medical oncology clinic on 04/15/2017.  At that time, she was seen for initial consultation.  She has iron deficiency anemia secondary to heavy menses.  She also noted unexplained bruising.  She had received 2 units PRBCs in the past as well as IV iron.  She had been on oral iron x 1 year.  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  Work-up on 04/15/2017 revealed a hematocrit of 28.6, hemoglobin 9.0, MCV 79.7, platelets 245,000, white count 4400 with an ANC of 2300.  Ferritin was 4. PT was 13.2 with an INR of 1.  Von Willebrand panel and platelet function assay were normal.  SPEP revealed a 0.5 g/dL IgG monoclonal protein with kappa light chain specificity.  IgG was 1713 ([phone]).  IgA was 453 (87-352).  Kappa free light chains were 30.4, lambda free light chain 17.8 with a ratio of 1.71 (0.26-1.65).  During the interim,   Past Medical History:  Diagnosis Date  . Anemia   . Depression   . Hypothyroidism   . Memory changes   . Migraines   . Vision changes     Past Surgical History:  Procedure Laterality Date  . [place], 2007    Family History  Problem Relation Age of Onset  . CAD Father   . Breast cancer Neg Hx     Social History:  reports that she has been smoking Cigarettes.  She has never used smokeless tobacco. Her alcohol and drug histories are not on file.  She drinks alcohol socially.  She is divorced.  She lives in [place].  The patient is alone today.  Allergies: No Known Allergies  Current Medications: Current Outpatient Prescriptions  Medication Sig Dispense Refill  . albuterol (PROVENTIL HFA;VENTOLIN HFA) 108 (90 Base) MCG/ACT inhaler Inhale 2 puffs into the lungs  every 6 (six) hours as needed.    . cetirizine (ZYRTEC) 10 MG tablet Take 10 mg by mouth daily.    . enalapril (VASOTEC) 2.5 MG tablet Take 2.5 mg by mouth daily.    . ferrous sulfate 325 (65 FE) MG tablet Take 1 tablet (325 mg total) by mouth 2 (two) times daily with a meal. 60 tablet 0  . levothyroxine (SYNTHROID) 50 MCG tablet Take 1 tablet (50 mcg total) by mouth daily before breakfast. 30 tablet 0  . levothyroxine (SYNTHROID, LEVOTHROID) 125 MCG tablet Take 125 mcg by mouth daily.    . meclizine (ANTIVERT) 12.5 MG tablet Take 12.5 mg by mouth 3 (three) times daily.    Marland Kitchen topiramate (TOPAMAX) 25 MG tablet Take 25 mg by mouth 2 (two) times daily.     No current facility-administered medications for this visit.     Review of Systems:  GENERAL:  Feels "pretty good".  No fevers, sweats or weight loss. PERFORMANCE STATUS (ECOG):  1 HEENT:  Mole behind left eye (eye weak; sees dots).  No runny nose, sore throat, mouth sores or tenderness. Lungs: Shortness of breath with exertion.  No cough.  No hemoptysis. Cardiac:  No chest pain, palpitations, orthopnea, or PND. GI:  No nausea, vomiting, diarrhea, constipation, melena or hematochezia. GU:  Trouble holding urine s/p 3 C sections.  No urgency, frequency,  dysuria, or hematuria. Musculoskeletal:  Legs hurt (left knee and hips hurt if sits too long".  No back pain.  No joint pain.  No muscle tenderness. Extremities:  No pain or swelling. Skin:  Unexplained bruise on shin.  No rashes or skin changes. Neuro:  Migraine headaches.  No numbness or weakness, balance or coordination issues. Endocrine:  No diabetes.  Hypothyroid on Synthroid.  No hot flashes or night sweats. Psych:  No mood changes, depression or anxiety. Pain:  No focal pain. Review of systems:  All other systems reviewed and found to be negative.  Physical Exam: There were no vitals taken for this visit. GENERAL:  Well developed, well nourished, sitting comfortably in the exam room  in no acute distress. MENTAL STATUS:  Alert and oriented to person, place and time. HEAD:  Normocephalic, atraumatic, face symmetric, no Cushingoid features. EYES:  Pupils equal round and reactive to light and accomodation.  No conjunctivitis or scleral icterus. ENT: Right upper lip piercing.  Oropharynx clear without lesion.  Tongue normal. Mucous membranes moist.  RESPIRATORY:  Clear to auscultation without rales, wheezes or rhonchi. CARDIOVASCULAR:  Regular rate and rhythm without murmur, rub or gallop. ABDOMEN:  Soft, non-tender, with active bowel sounds, and no hepatosplenomegaly.  No masses. SKIN:  Tattoo.  No petechiae.  No rashes, ulcers or lesions. EXTREMITIES: No edema, no skin discoloration or tenderness.  No palpable cords. LYMPH NODES: No palpable cervical, supraclavicular, axillary or inguinal adenopathy  NEUROLOGICAL: Unremarkable. PSYCH:  Appropriate.   No visits with results within 3 Day(s) from this visit.  Latest known visit with results is:  Office Visit on 04/15/2017  Component Date Value Ref Range Status  . WBC 04/15/2017 4.4  3.6 - 11.0 K/uL Final  . RBC 04/15/2017 3.59* 3.80 - 5.20 MIL/uL Final  . Hemoglobin 04/15/2017 9.0* 12.0 - 16.0 g/dL Final  . HCT 04/15/2017 28.6* 35.0 - 47.0 % Final  . MCV 04/15/2017 79.7* 80.0 - 100.0 fL Final  . MCH 04/15/2017 25.2* 26.0 - 34.0 pg Final  . MCHC 04/15/2017 31.6* 32.0 - 36.0 g/dL Final  . RDW 04/15/2017 17.5* 11.5 - 14.5 % Final  . Platelets 04/15/2017 245  150 - 440 K/uL Final  . Neutrophils Relative % 04/15/2017 52  % Final  . Neutro Abs 04/15/2017 2.3  1.4 - 6.5 K/uL Final  . Lymphocytes Relative 04/15/2017 37  % Final  . Lymphs Abs 04/15/2017 1.6  1.0 - 3.6 K/uL Final  . Monocytes Relative 04/15/2017 8  % Final  . Monocytes Absolute 04/15/2017 0.3  0.2 - 0.9 K/uL Final  . Eosinophils Relative 04/15/2017 2  % Final  . Eosinophils Absolute 04/15/2017 0.1  0 - 0.7 K/uL Final  . Basophils Relative 04/15/2017 1  %  Final  . Basophils Absolute 04/15/2017 0.0  0 - 0.1 K/uL Final  . Ferritin 04/15/2017 4* 11 - 307 ng/mL Final  . Prothrombin Time 04/15/2017 13.2  11.4 - 15.2 seconds Final  . INR 04/15/2017 1.00   Final  . Coagulation Factor VIII 04/15/2017 209* 57 - 163 % Final   Comment: (NOTE) FVIII activity can increase in a variety of clinical situations including normal pregnancy, in samples drawn from patients (particularly children) who are visibly stressed at the time of phlebotomy, as acute phase reactants, or in response to certain drug therapies such as DDAVP.  Persistently elevated FVIII activity is a risk factor for venous thrombosis as well as recurrence of venous thrombosis.  Risk is graded and  increases with the degree of elevation.  Although elevated FVIII activity has been identified to cluster within families, a genetic basis for the elevation has not yet been elucidated (Br J Haematol. 2012; 188:416-606).   . Ristocetin Co-factor, Plasma 04/15/2017 65  50 - 200 % Final   Comment: (NOTE) Performed At: [facility] [place], [place] [id number] Lindon Romp MD AT:[id number]   . Von Willebrand Antigen, Plasma 04/15/2017 136  50 - 200 % Final   Comment: (NOTE) This test was developed and its performance characteristics determined by [facility]. It has not been cleared or approved by the Food and Drug Administration.   Marland Kitchen PFA Interpretation 04/15/2017          Final   Comment: Platelet function is normal. If patient history/physical examination give strong indication of a bleeding disorder repeat testing for confirmation.        Results of the test should always be interpreted in conjunction with the patient's medical history, clinical presentation and medication history. Patients with Hematocrit values <35.0% or Platelet counts <150,000/uL may result in values above the Laboratory established reference range.   . Collagen / Epinephrine 04/15/2017 95   0 - 193 seconds Final   Performed at [facility], [address] [place], [place] [address]  . IgG (Immunoglobin G), Serum 04/15/2017 1713* 700 - 1,600 mg/dL Final  . IgA 04/15/2017 453* 87 - 352 mg/dL Final  . IgM, Serum 04/15/2017 70  26 - 217 mg/dL Final  . Total Protein ELP 04/15/2017 7.4  6.0 - 8.5 g/dL Corrected  . Albumin SerPl Elph-Mcnc 04/15/2017 3.6  2.9 - 4.4 g/dL Corrected  . Alpha 1 04/15/2017 0.2  0.0 - 0.4 g/dL Corrected  . Alpha2 Glob SerPl Elph-Mcnc 04/15/2017 0.6  0.4 - 1.0 g/dL Corrected  . B-Globulin SerPl Elph-Mcnc 04/15/2017 1.3  0.7 - 1.3 g/dL Corrected  . Gamma Glob SerPl Elph-Mcnc 04/15/2017 1.7  0.4 - 1.8 g/dL Corrected  . M Protein SerPl Elph-Mcnc 04/15/2017 0.5* Not Observed g/dL Corrected  . Globulin, Total 04/15/2017 3.8  2.2 - 3.9 g/dL Corrected  . Albumin/Glob SerPl 04/15/2017 1.0  0.7 - 1.7 Corrected  . IFE 1 04/15/2017 Comment   Corrected   Comment: (NOTE) Immunofixation shows IgG monoclonal protein with kappa light chain specificity. An apparent polyclonal gammopathy: IgA. Kappa and lambda typing appear increased.   . Please Note 04/15/2017 Comment   Corrected   Comment: (NOTE) Protein electrophoresis scan will follow via computer, mail, or courier delivery. Performed At: [facility] [place], [place] [id number] Lindon Romp MD VO:[id number]   . Kappa free light chain 04/15/2017 30.4* 3.3 - 19.4 mg/L Final  . Lamda free light chains 04/15/2017 17.8  5.7 - 26.3 mg/L Final  . Kappa, lamda light chain ratio 04/15/2017 1.71* 0.26 - 1.65 Final   Comment: (NOTE) Performed At: [facility] [place], [place] [id number] Lindon Romp MD OJ:[id number]   . Interpretation 04/15/2017 Note   Final   Comment: (NOTE) ------------------------------- COAGULATION: VON WILLEBRAND FACTOR ASSESSMENT CURRENT RESULTS ASSESSMENT The VWF:Ag is normal. The VWF:RCo is normal. The FVIII is elevated. VON WILLEBRAND  FACTOR ASSESSMENT CURRENT RESULTS INTERPRETATION - These results are not consistent with a diagnosis of VWD according to the current NHLBI guideline. Persistently elevated FVIII activity is a risk factor for venous thrombosis as well as recurrence of venous thrombosis. Risk is graded and increases with the degree of elevation. Although elevated FVIII activity has  been identified to cluster within families, a genetic basis for the elevation has not yet been elucidated (Br J Haematol. 2012; 157(6):653-663). VON WILLEBRAND FACTOR ASSESSMENT - Results may be falsely elevated and possibly falsely normal as VWF and FVIII may increase in pregnancy, in samples drawn from patients (particularly children) who are visibly stressed at the time of phlebotomy, as acute phase reactants                          , or in response to certain drug therapies such as desmopressin. Repeat testing may be necessary before excluding a diagnosis of VWD especially if the clinical suspicion is high for an underlying bleeding disorder. The setting for phlebotomy should be as calm as possible and patients should be encouraged to sit quietly prior to the blood draw. VON WILLEBRAND FACTOR ASSESSMENT DEFINITIONS - VWD - von Willebrand disease; VWF - von Willebrand factor; VWF:Ag - VWF antigen; VWF:RCo - VWF ristocetin cofactor activity; FVIII - factor VIII activity. - MEDICAL DIRECTOR: For questions regarding panel interpretation, please contact Jake Bathe, M.D. at [facility] at [phone]. ------------------------------- DISCLAIMER These assessments and interpretations are provided as a convenience in support of the physician-patient relationship and are not intended to replace the physician's clinical judgment. They are derived from national guidelines in addit                          ion to other evidence and expert opinion. The clinician should consider this information within  the context of clinical opinion and the individual patient. SEE GUIDANCE FOR VON WILLEBRAND FACTOR ASSESSMENT: (1) The National Heart, Lung and Blood Institute. The Diagnosis, Evaluation and Management of von Willebrand Disease. Janeal Holmes, MD: [place] Publication 05-6760. 9509. Available at vSpecials.com.pt. (2) Daryl Eastern et al. [place] J Hematol. 2009; 84(6):366-370. (3) [place]. 2004;10(3):199-217. (4) Pasi KJ et al. Haemophilia. 2004; 10(3):218-231. Performed At: [facility] [place], [place] [id number] Thomasene Ripple MD [id number]     Assessment:  YAHAIRA BRUSKI is a 51 y.o. female with iron deficiency anemia secondary to menorrhagia.  She denies any melena, hematochezia or hematuria.  Diet is good.  She has been on oral iron BID x 1 year.  She has a history of ice pica.  She received 2 units of PRBCs on 08/29/2015 for menorrhagia.  She has received IV iron 2 years ago at [facility].  CBC on 03/25/2017 revealed a hematocrit of 31.6, hemoglobin 9.7, and MCV 83.  Ferritin was 5. Iron saturation was 4% and TIBC of 568.  She has a history of a monoclonal gammopathy.  SPEP on 09/27/2015 revealed an M spike of 0.5 g/dL. Immunofixation revealed an IgG monoclonal protein with kappa light chain specificity.  IgG was 1901 ([phone]) and IgA 514 (87-352).  She bruises easily.  She denies any excess bleeding with surgeries.  She does not take aspirin or ibuprofen.  She denies any family history of bleeding.  Symptomatically, she has shortness of breath with exertion.  Exam is unremarkable.  Plan: 1.  Labs today:  PTT, CMP, LDH, beta2-microglobulin. 2.  24 hour urine for UPEP and free light chains. 3.  Begin weekly Venofer x 4.  Ferritin goal 100. 4.  RTC in 6 weeks for labs (CBC with diff, ferritin- day before) +/- Venofer.  Discuss diagnosis of iron deficiency anemia due to  menorrhagia.  Discuss work-up for bleeding diathesis.  Discuss low ferritin despite 1 year of oral iron.  Discuss reinstitution of IV iron.  Goal ferritin is 100. 2.  Discuss monoclonal gammopathy.  She denies any bone pain or recurrent infections.  Discuss rechecking SPEP.  If monoclonal gammopathy persists, discuss further work-up. 3.  Labs today: CBC with diff, ferritin, PT/INR, von Willebrand panel, platelet function assay, SPEP with immunoglobulins, free light chain assay. 4.  Guaiac cards x3. 5.  Preauth Venofer 6.  RTC in 1 week for review of labs and +/- Venofer   Lequita Asal, MD  04/22/2017, 5:17 AM

## 2017-04-23 NOTE — Progress Notes (Unsigned)
PSN left patient a voice mail message, today, asking her to call to discuss her housing issues.

## 2017-04-24 DIAGNOSIS — D509 Iron deficiency anemia, unspecified: Secondary | ICD-10-CM | POA: Diagnosis not present

## 2017-04-29 ENCOUNTER — Inpatient Hospital Stay (HOSPITAL_BASED_OUTPATIENT_CLINIC_OR_DEPARTMENT_OTHER): Payer: Medicaid Other | Admitting: Hematology and Oncology

## 2017-04-29 ENCOUNTER — Encounter: Payer: Self-pay | Admitting: Hematology and Oncology

## 2017-04-29 ENCOUNTER — Inpatient Hospital Stay: Payer: Medicaid Other

## 2017-04-29 VITALS — BP 120/81 | HR 89 | Temp 98.2°F | Resp 18 | Wt 199.7 lb

## 2017-04-29 DIAGNOSIS — H539 Unspecified visual disturbance: Secondary | ICD-10-CM

## 2017-04-29 DIAGNOSIS — E039 Hypothyroidism, unspecified: Secondary | ICD-10-CM | POA: Diagnosis not present

## 2017-04-29 DIAGNOSIS — F329 Major depressive disorder, single episode, unspecified: Secondary | ICD-10-CM | POA: Diagnosis not present

## 2017-04-29 DIAGNOSIS — D472 Monoclonal gammopathy: Secondary | ICD-10-CM

## 2017-04-29 DIAGNOSIS — N920 Excessive and frequent menstruation with regular cycle: Secondary | ICD-10-CM

## 2017-04-29 DIAGNOSIS — R233 Spontaneous ecchymoses: Secondary | ICD-10-CM

## 2017-04-29 DIAGNOSIS — F413 Other mixed anxiety disorders: Secondary | ICD-10-CM

## 2017-04-29 DIAGNOSIS — F17210 Nicotine dependence, cigarettes, uncomplicated: Secondary | ICD-10-CM | POA: Diagnosis not present

## 2017-04-29 DIAGNOSIS — D500 Iron deficiency anemia secondary to blood loss (chronic): Secondary | ICD-10-CM

## 2017-04-29 DIAGNOSIS — R42 Dizziness and giddiness: Secondary | ICD-10-CM

## 2017-04-29 DIAGNOSIS — R0602 Shortness of breath: Secondary | ICD-10-CM

## 2017-04-29 DIAGNOSIS — R238 Other skin changes: Secondary | ICD-10-CM

## 2017-04-29 DIAGNOSIS — D649 Anemia, unspecified: Secondary | ICD-10-CM

## 2017-04-29 DIAGNOSIS — D509 Iron deficiency anemia, unspecified: Secondary | ICD-10-CM | POA: Diagnosis not present

## 2017-04-29 DIAGNOSIS — Z79899 Other long term (current) drug therapy: Secondary | ICD-10-CM

## 2017-04-29 LAB — COMPREHENSIVE METABOLIC PANEL
ALT: 10 U/L — ABNORMAL LOW (ref 14–54)
AST: 18 U/L (ref 15–41)
Albumin: 4.1 g/dL (ref 3.5–5.0)
Alkaline Phosphatase: 32 U/L — ABNORMAL LOW (ref 38–126)
Anion gap: 6 (ref 5–15)
BUN: 16 mg/dL (ref 6–20)
CO2: 26 mmol/L (ref 22–32)
Calcium: 8.9 mg/dL (ref 8.9–10.3)
Chloride: 103 mmol/L (ref 101–111)
Creatinine, Ser: 0.81 mg/dL (ref 0.44–1.00)
GFR calc Af Amer: >60 mL/min (ref >60)
GFR calc non Af Amer: >60 mL/min (ref >60)
Glucose, Bld: 113 mg/dL — ABNORMAL HIGH (ref 65–99)
Potassium: 3.7 mmol/L (ref 3.5–5.1)
Sodium: 135 mmol/L (ref 135–145)
Total Bilirubin: 0.6 mg/dL (ref 0.3–1.2)
Total Protein: 8.5 g/dL — ABNORMAL HIGH (ref 6.5–8.1)

## 2017-04-29 LAB — OCCULT BLOOD X 1 CARD TO LAB, STOOL
Fecal Occult Bld: NEGATIVE
Fecal Occult Bld: POSITIVE — AB
Fecal Occult Bld: NEGATIVE

## 2017-04-29 LAB — LACTATE DEHYDROGENASE: LDH: 138 U/L (ref 98–192)

## 2017-04-29 LAB — APTT: aPTT: 28 seconds (ref 24–36)

## 2017-04-29 MED ORDER — IRON SUCROSE 20 MG/ML IV SOLN
200.0000 mg | Freq: Once | INTRAVENOUS | Status: AC
  Administered 2017-04-29: 200 mg via INTRAVENOUS

## 2017-04-29 MED ORDER — SODIUM CHLORIDE 0.9 % IV SOLN
Freq: Once | INTRAVENOUS | Status: AC
  Administered 2017-04-29: 14:00:00 via INTRAVENOUS
  Filled 2017-04-29: qty 1000

## 2017-04-29 MED ORDER — SODIUM CHLORIDE 0.9 % IV SOLN: 200.0000 mg | Freq: Once | INTRAVENOUS | Status: DC

## 2017-04-29 NOTE — Progress Notes (Addendum)
[facility] day:  04/29/2017   Chief Complaint: Katherine Brennan is a 51 y.o. female with iron deficiency anemia who is seen for review of work-up and discussion regarding direction of therapy.  HPI:  The patient was last seen in the medical oncology clinic on 04/15/2017.  At that time, she was seen for initial consultation.  She had iron deficiency anemia secondary to heavy menses.  She also noted unexplained bruising.  She had received 2 units PRBCs in the past as well as IV iron.  She had been on oral iron x 1 year.  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  Work-up on 04/15/2017 revealed a hematocrit of 28.6, hemoglobin 9.0, MCV 79.7, platelets 245,000, white count 4400 with an ANC of 2300.  Ferritin was 4. PT was 13.2 with an INR of 1.  Von Willebrand panel and platelet function assay were normal.  SPEP revealed a 0.5 g/dL IgG monoclonal protein with kappa light chain specificity.  IgG was 1713 ([phone]).  IgA was 453 (87-352).  Kappa free light chains were 30.4, lambda free light chain 17.8 with a ratio of 1.71 (0.26-1.65).  During the interim, she has felt good.  She has continued on her oral iron.  She denies any melena or hematochezia.  One of 3 stools were guaiac positive.  She denies having her menses at that time. Her stool has not been solid for about a year. She would like to be seen by gastroenterology.   Past Medical History:  Diagnosis Date  . Anemia   . Depression   . Hypothyroidism   . Memory changes   . Migraines   . Vision changes     Past Surgical History:  Procedure Laterality Date  . [place], 2007    Family History  Problem Relation Age of Onset  . CAD Father   . Breast cancer Neg Hx     Social History:  reports that she has been smoking Cigarettes.  She has never used smokeless tobacco. Her alcohol and drug histories are not on file.  She drinks alcohol socially.  She is divorced.  She was  late to her clinic appointment today as she had to bring her child to the cardiologist.  She lives in [place].  The patient is alone today.  Allergies: No Known Allergies  Current Medications: Current Outpatient Prescriptions  Medication Sig Dispense Refill  . albuterol (PROVENTIL HFA;VENTOLIN HFA) 108 (90 Base) MCG/ACT inhaler Inhale 2 puffs into the lungs every 6 (six) hours as needed.    . cetirizine (ZYRTEC) 10 MG tablet Take 10 mg by mouth daily.    . enalapril (VASOTEC) 2.5 MG tablet Take 2.5 mg by mouth daily.    . ferrous sulfate 325 (65 FE) MG tablet Take 1 tablet (325 mg total) by mouth 2 (two) times daily with a meal. 60 tablet 0  . levothyroxine (SYNTHROID) 50 MCG tablet Take 1 tablet (50 mcg total) by mouth daily before breakfast. 30 tablet 0  . levothyroxine (SYNTHROID, LEVOTHROID) 125 MCG tablet Take 125 mcg by mouth daily.    . meclizine (ANTIVERT) 12.5 MG tablet Take 12.5 mg by mouth 3 (three) times daily.    Marland Kitchen topiramate (TOPAMAX) 25 MG tablet Take 25 mg by mouth 2 (two) times daily.     No current facility-administered medications for this visit.     Review of Systems:  GENERAL:  Feels "good".  No fevers or  sweats.  Weight up 2 pounds. PERFORMANCE STATUS (ECOG):  1 HEENT:  Mole behind left eye (eye weak; sees dots).  No runny nose, sore throat, mouth sores or tenderness. Lungs: Shortness of breath with exertion.  No cough.  No hemoptysis. Cardiac:  No chest pain, palpitations, orthopnea, or PND. GI:  Stool not solid x 1 year.  No nausea, vomiting, diarrhea, constipation, melena or hematochezia. GU:  Trouble holding urine s/p 3 C sections.  No urgency, frequency, dysuria, or hematuria. Musculoskeletal:  Legs hurt (left knee and hips hurt if sits too long).  No back pain.  No joint pain.  No muscle tenderness. Extremities:  No pain or swelling. Skin:  Unexplained bruises (chronic).  No rashes or skin changes. Neuro:  Migraine headaches.  No numbness or weakness, balance  or coordination issues. Endocrine:  No diabetes.  Hypothyroid on Synthroid.  No hot flashes or night sweats. Psych:  No mood changes, depression or anxiety. Pain:  No focal pain. Review of systems:  All other systems reviewed and found to be negative.  Physical Exam: Blood pressure 120/81, pulse 89, temperature 98.2 F (36.8 C), temperature source Tympanic, resp. rate 18, weight 199 lb 11.8 oz (90.6 kg). GENERAL:  Well developed, well nourished, woman sitting comfortably in the exam room in no acute distress. MENTAL STATUS:  Alert and oriented to person, place and time. HEAD:  Dark brown hair.  Normocephalic, atraumatic, face symmetric, no Cushingoid features. EYES:  Pupils equal round and reactive to light and accomodation.  No conjunctivitis or scleral icterus. ENT: Right upper lip piercing.  Oropharynx clear without lesion.  Tongue normal. Mucous membranes moist.  RESPIRATORY:  Clear to auscultation without rales, wheezes or rhonchi. CARDIOVASCULAR:  Regular rate and rhythm without murmur, rub or gallop. ABDOMEN:  Soft, non-tender, with active bowel sounds, and no hepatosplenomegaly.  No masses. SKIN:  Tattoo.  No petechiae.  No rashes, ulcers or lesions. EXTREMITIES: No edema, no skin discoloration or tenderness.  No palpable cords. LYMPH NODES: No palpable cervical, supraclavicular, axillary or inguinal adenopathy  NEUROLOGICAL: Unremarkable. PSYCH:  Appropriate.   Appointment on 04/29/2017  Component Date Value Ref Range Status  . Fecal Occult Bld 04/22/2017 NEGATIVE  NEGATIVE Final  . Fecal Occult Bld 04/24/2017 POSITIVE* NEGATIVE Final  . Fecal Occult Bld 04/29/2017 NEGATIVE  NEGATIVE Final  . Sodium 04/29/2017 135  135 - 145 mmol/L Final  . Potassium 04/29/2017 3.7  3.5 - 5.1 mmol/L Final  . Chloride 04/29/2017 103  101 - 111 mmol/L Final  . CO2 04/29/2017 26  22 - 32 mmol/L Final  . Glucose, Bld 04/29/2017 113* 65 - 99 mg/dL Final  . BUN 04/29/2017 16  6 - 20 mg/dL Final   . Creatinine, Ser 04/29/2017 0.81  0.44 - 1.00 mg/dL Final  . Calcium 04/29/2017 8.9  8.9 - 10.3 mg/dL Final  . Total Protein 04/29/2017 8.5* 6.5 - 8.1 g/dL Final  . Albumin 04/29/2017 4.1  3.5 - 5.0 g/dL Final  . AST 04/29/2017 18  15 - 41 U/L Final  . ALT 04/29/2017 10* 14 - 54 U/L Final  . Alkaline Phosphatase 04/29/2017 32* 38 - 126 U/L Final  . Total Bilirubin 04/29/2017 0.6  0.3 - 1.2 mg/dL Final  . GFR calc non Af Amer 04/29/2017 >60  >60 mL/min Final  . GFR calc Af Amer 04/29/2017 >60  >60 mL/min Final   Comment: (NOTE) The eGFR has been calculated using the CKD EPI equation. This calculation has not been validated  in all clinical situations. eGFR's persistently <60 mL/min signify possible Chronic Kidney Disease.   . Anion gap 04/29/2017 6  5 - 15 Final  . LDH 04/29/2017 138  98 - 192 U/L Final    Assessment:  JANEISHA RYLE is a 51 y.o. female with iron deficiency anemia secondary to menorrhagia.  She denies any melena, hematochezia or hematuria.  Diet is good.  She has been on oral iron BID x 1 year.  She has a history of ice pica.  She received 2 units of PRBCs on 08/29/2015 for menorrhagia.  She has received IV iron 2 years ago at [facility].  Work-up on 04/15/2017 revealed a hematocrit of 28.6, hemoglobin 9.0, MCV 79.7, platelets 245,000, white count 4400 with an ANC of 2300.  Ferritin was 4.  One of 3 cards were guaiac positive in 04/2017.  She has a history of a monoclonal gammopathy.  SPEP on 04/15/2017 revealed a 0.5 g/dL IgG monoclonal protein with kappa light chain specificity.  IgG was 1713 ([phone]).  Free light chains ratio was 1.71 (0.26-1.65).  SPEP on 09/27/2015 revealed an M spike of 0.5 g/dL.   She bruises easily.  She denies any excess bleeding with surgeries.  She does not take aspirin or ibuprofen.  She denies any family history of bleeding.  Work-up on 04/15/2017 revealed the following normal studies:  PT/INR, von Willebrand panel, platelet function  assay.  Symptomatically, she feels good.  Exam is unremarkable.  Plan: 1.  Labs today:  PTT, CMP, LDH, beta 2-microglobulin. 2.  Discuss labs from last visit.  Patient anemic (hematocrit 28.6) and ferritin low (4) after 1 year of oral iron.  Etiology felt secondary to menorrhagia.  One of 3 stools + occult blood.  Discuss referral to GI.  Patient in agreement. 3.  Discuss institution of Venofer to replete iron stores and improve hemoglobin.  Side effects reviewed.  Discuss initiation of Venofer x 4.  Oral iron will be discontinued.  Patient consented to treatment. 4.  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  Check 24 hour UPEP with free light chains. 5.  Consult gastroenterology:  Iron deficiency anemia; 1 of 3 guaiac + stools; change in stool quality. 6.  Begin weekly Venofer x 4.  Ferritin goal 100. 7.  Check CBC and ferritin in 2 weeks. 8.  RTC in 6 weeks for labs (CBC with diff, ferritin- day before) +/- Venofer.   Lequita Asal, MD  04/29/2017, 2:03 PM

## 2017-04-29 NOTE — Addendum Note (Signed)
Addended by: Nolon Stalls C on: 04/29/2017 05:46 PM   Modules accepted: Level of Service

## 2017-04-29 NOTE — Progress Notes (Signed)
Patient offers no complaints today. 

## 2017-04-29 NOTE — Patient Instructions (Signed)

## 2017-04-30 LAB — BETA 2 MICROGLOBULIN, SERUM: Beta-2 Microglobulin: 1.3 mg/L (ref 0.6–2.4)

## 2017-05-01 ENCOUNTER — Inpatient Hospital Stay: Payer: Medicaid Other

## 2017-05-01 DIAGNOSIS — R233 Spontaneous ecchymoses: Secondary | ICD-10-CM

## 2017-05-01 DIAGNOSIS — D472 Monoclonal gammopathy: Secondary | ICD-10-CM

## 2017-05-01 DIAGNOSIS — D500 Iron deficiency anemia secondary to blood loss (chronic): Secondary | ICD-10-CM

## 2017-05-01 DIAGNOSIS — R238 Other skin changes: Secondary | ICD-10-CM

## 2017-05-01 DIAGNOSIS — D509 Iron deficiency anemia, unspecified: Secondary | ICD-10-CM | POA: Diagnosis not present

## 2017-05-04 LAB — IFE+PROTEIN ELECTRO, 24-HR UR
% BETA, Urine: 28.1 %
ALPHA 1 URINE: 0.8 %
Albumin, U: 19.5 %
Alpha 2, Urine: 13.7 %
GAMMA GLOBULIN URINE: 38.1 %
Total Protein, Urine-Ur/day: 76 mg/24 hr (ref 30–150)
Total Protein, Urine: 10.8 mg/dL
Total Volume: 700

## 2017-05-06 ENCOUNTER — Inpatient Hospital Stay: Payer: Medicaid Other

## 2017-05-06 ENCOUNTER — Ambulatory Visit: Payer: Medicaid Other

## 2017-05-13 ENCOUNTER — Inpatient Hospital Stay: Payer: Medicaid Other

## 2017-05-13 ENCOUNTER — Inpatient Hospital Stay: Payer: Medicaid Other | Attending: Hematology and Oncology

## 2017-05-13 VITALS — BP 104/70 | HR 86 | Temp 97.4°F | Resp 18

## 2017-05-13 DIAGNOSIS — R238 Other skin changes: Secondary | ICD-10-CM

## 2017-05-13 DIAGNOSIS — Z79899 Other long term (current) drug therapy: Secondary | ICD-10-CM | POA: Diagnosis not present

## 2017-05-13 DIAGNOSIS — D500 Iron deficiency anemia secondary to blood loss (chronic): Secondary | ICD-10-CM | POA: Diagnosis not present

## 2017-05-13 DIAGNOSIS — R233 Spontaneous ecchymoses: Secondary | ICD-10-CM

## 2017-05-13 DIAGNOSIS — D472 Monoclonal gammopathy: Secondary | ICD-10-CM

## 2017-05-13 LAB — CBC WITH DIFFERENTIAL/PLATELET
Basophils Absolute: 0 10*3/uL (ref 0–0.1)
Basophils Relative: 1 %
Eosinophils Absolute: 0 10*3/uL (ref 0–0.7)
Eosinophils Relative: 1 %
HCT: 30.3 % — ABNORMAL LOW (ref 35.0–47.0)
Hemoglobin: 9.4 g/dL — ABNORMAL LOW (ref 12.0–16.0)
Lymphocytes Relative: 30 %
Lymphs Abs: 1.2 10*3/uL (ref 1.0–3.6)
MCH: 24.9 pg — ABNORMAL LOW (ref 26.0–34.0)
MCHC: 30.9 g/dL — ABNORMAL LOW (ref 32.0–36.0)
MCV: 80.6 fL (ref 80.0–100.0)
Monocytes Absolute: 0.2 10*3/uL (ref 0.2–0.9)
Monocytes Relative: 6 %
Neutro Abs: 2.4 10*3/uL (ref 1.4–6.5)
Neutrophils Relative %: 62 %
Platelets: 269 10*3/uL (ref 150–440)
RBC: 3.77 MIL/uL — ABNORMAL LOW (ref 3.80–5.20)
RDW: 18.6 % — ABNORMAL HIGH (ref 11.5–14.5)
WBC: 3.9 10*3/uL (ref 3.6–11.0)

## 2017-05-13 LAB — FERRITIN: Ferritin: 18 ng/mL (ref 11–307)

## 2017-05-13 MED ORDER — SODIUM CHLORIDE 0.9 % IV SOLN
Freq: Once | INTRAVENOUS | Status: AC
  Administered 2017-05-13: 14:00:00 via INTRAVENOUS
  Filled 2017-05-13: qty 1000

## 2017-05-13 MED ORDER — IRON SUCROSE 20 MG/ML IV SOLN
200.0000 mg | Freq: Once | INTRAVENOUS | Status: AC
  Administered 2017-05-13: 200 mg via INTRAVENOUS
  Filled 2017-05-13: qty 10

## 2017-05-13 MED ORDER — SODIUM CHLORIDE 0.9 % IV SOLN: 200.0000 mg | Freq: Once | INTRAVENOUS | Status: DC

## 2017-05-13 NOTE — Patient Instructions (Signed)

## 2017-05-20 ENCOUNTER — Inpatient Hospital Stay: Payer: Medicaid Other

## 2017-05-20 VITALS — BP 125/80 | HR 73 | Temp 97.1°F | Resp 18

## 2017-05-20 DIAGNOSIS — D500 Iron deficiency anemia secondary to blood loss (chronic): Secondary | ICD-10-CM

## 2017-05-20 MED ORDER — IRON SUCROSE 20 MG/ML IV SOLN
200.0000 mg | Freq: Once | INTRAVENOUS | Status: AC
  Administered 2017-05-20: 200 mg via INTRAVENOUS

## 2017-05-20 MED ORDER — SODIUM CHLORIDE 0.9 % IV SOLN: 200.0000 mg | Freq: Once | INTRAVENOUS | Status: DC

## 2017-05-20 MED ORDER — SODIUM CHLORIDE 0.9 % IV SOLN
Freq: Once | INTRAVENOUS | Status: AC
  Administered 2017-05-20: 10:00:00 via INTRAVENOUS
  Filled 2017-05-20: qty 1000

## 2017-05-20 MED ORDER — IRON SUCROSE 20 MG/ML IV SOLN
INTRAVENOUS | Status: AC
  Filled 2017-05-20: qty 10

## 2017-05-27 ENCOUNTER — Ambulatory Visit: Payer: Medicaid Other

## 2017-05-27 ENCOUNTER — Inpatient Hospital Stay: Payer: Medicaid Other

## 2017-05-27 VITALS — BP 114/78 | HR 75 | Temp 97.2°F | Resp 18

## 2017-05-27 DIAGNOSIS — D500 Iron deficiency anemia secondary to blood loss (chronic): Secondary | ICD-10-CM

## 2017-05-27 MED ORDER — SODIUM CHLORIDE 0.9 % IV SOLN
Freq: Once | INTRAVENOUS | Status: AC
  Administered 2017-05-27: 11:00:00 via INTRAVENOUS
  Filled 2017-05-27: qty 1000

## 2017-05-27 MED ORDER — SODIUM CHLORIDE 0.9 % IV SOLN: 200.0000 mg | Freq: Once | INTRAVENOUS | Status: DC

## 2017-05-27 MED ORDER — IRON SUCROSE 20 MG/ML IV SOLN
200.0000 mg | Freq: Once | INTRAVENOUS | Status: AC
  Administered 2017-05-27: 200 mg via INTRAVENOUS
  Filled 2017-05-27: qty 10

## 2017-06-03 ENCOUNTER — Ambulatory Visit: Payer: Medicaid Other

## 2017-06-09 ENCOUNTER — Other Ambulatory Visit: Payer: Medicaid Other

## 2017-06-10 ENCOUNTER — Ambulatory Visit: Payer: Medicaid Other

## 2017-06-10 ENCOUNTER — Ambulatory Visit: Payer: Medicaid Other | Admitting: Hematology and Oncology

## 2017-06-16 ENCOUNTER — Inpatient Hospital Stay: Payer: Medicaid Other | Attending: Internal Medicine

## 2017-06-16 DIAGNOSIS — F5089 Other specified eating disorder: Secondary | ICD-10-CM | POA: Insufficient documentation

## 2017-06-16 DIAGNOSIS — E039 Hypothyroidism, unspecified: Secondary | ICD-10-CM | POA: Diagnosis not present

## 2017-06-16 DIAGNOSIS — N920 Excessive and frequent menstruation with regular cycle: Secondary | ICD-10-CM | POA: Diagnosis not present

## 2017-06-16 DIAGNOSIS — F329 Major depressive disorder, single episode, unspecified: Secondary | ICD-10-CM | POA: Insufficient documentation

## 2017-06-16 DIAGNOSIS — D509 Iron deficiency anemia, unspecified: Secondary | ICD-10-CM | POA: Diagnosis present

## 2017-06-16 DIAGNOSIS — Z79899 Other long term (current) drug therapy: Secondary | ICD-10-CM | POA: Diagnosis not present

## 2017-06-16 DIAGNOSIS — D472 Monoclonal gammopathy: Secondary | ICD-10-CM | POA: Insufficient documentation

## 2017-06-16 DIAGNOSIS — D500 Iron deficiency anemia secondary to blood loss (chronic): Secondary | ICD-10-CM

## 2017-06-16 DIAGNOSIS — G2581 Restless legs syndrome: Secondary | ICD-10-CM | POA: Diagnosis not present

## 2017-06-16 DIAGNOSIS — R238 Other skin changes: Secondary | ICD-10-CM

## 2017-06-16 DIAGNOSIS — R233 Spontaneous ecchymoses: Secondary | ICD-10-CM

## 2017-06-16 DIAGNOSIS — F17210 Nicotine dependence, cigarettes, uncomplicated: Secondary | ICD-10-CM | POA: Insufficient documentation

## 2017-06-16 LAB — CBC WITH DIFFERENTIAL/PLATELET
Basophils Absolute: 0.1 10*3/uL (ref 0–0.1)
Basophils Relative: 2 %
Eosinophils Absolute: 0.1 10*3/uL (ref 0–0.7)
Eosinophils Relative: 2 %
HCT: 33.7 % — ABNORMAL LOW (ref 35.0–47.0)
Hemoglobin: 10.8 g/dL — ABNORMAL LOW (ref 12.0–16.0)
Lymphocytes Relative: 45 %
Lymphs Abs: 1.5 10*3/uL (ref 1.0–3.6)
MCH: 27.7 pg (ref 26.0–34.0)
MCHC: 32 g/dL (ref 32.0–36.0)
MCV: 86.7 fL (ref 80.0–100.0)
Monocytes Absolute: 0.3 10*3/uL (ref 0.2–0.9)
Monocytes Relative: 9 %
Neutro Abs: 1.4 10*3/uL (ref 1.4–6.5)
Neutrophils Relative %: 42 %
Platelets: 230 10*3/uL (ref 150–440)
RBC: 3.89 MIL/uL (ref 3.80–5.20)
RDW: 23.8 % — ABNORMAL HIGH (ref 11.5–14.5)
WBC: 3.3 10*3/uL — ABNORMAL LOW (ref 3.6–11.0)

## 2017-06-16 LAB — FERRITIN: Ferritin: 25 ng/mL (ref 11–307)

## 2017-06-16 NOTE — Progress Notes (Signed)
[facility] day:  06/17/2017   Chief Complaint: Katherine Brennan is a 51 y.o. female with iron deficiency anemia who is seen for 6 week assessment.  HPI:  The patient was last seen in the medical oncology clinic on 04/29/2017.  At that time, she felt "good".  She was on oral iron supplementation daily for over a year. One of the 3 cards testing for fecal occult blood resulted positive. Patient reported that her stool has not been solid for a year. Patient was referred to gastroenterology for further evaluation and workup. Venofer 4 was ordered.   Patient received Venofer on 04/29/2017, 05/13/2017, 05/20/2017, and 05/27/2017.  CBC was rechecked on 05/13/2017 revealing a hemoglobin of 9.4, hematocrit 30.3, MCV 80.6, and platelets 269,000.  Ferritin was 18.  She has a monoclonal gammopathy of unknown significance (MGUS).  24 hour UPEP was negative.  Free urine light chains were not included in the result data.   During the interim, patient feeling "pretty darn well" following iron infusions.  She has more energy. She denies chest pain. She does have some exertional dyspnea.  She denies hematochezia, melena, and epistaxis.  She continues to have heavy menses.  Last menses lasted 8 days.  She does not have a gynecologist.  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 Patient has "pins and needles" sensation in her right hand that sometimes "wakes her up at night".  She is scheduled to see Dr. Allen Norris tomorrow for further evaluation.    Past Medical History:  Diagnosis Date  . Anemia   . Depression   . Hypothyroidism   . Memory changes   . Migraines   . Vision changes     Past Surgical History:  Procedure Laterality Date  . [place], 2007    Family History  Problem Relation Age of Onset  . CAD Father   . Breast cancer Neg Hx     Social History:  reports that she has been smoking Cigarettes.  She has never used smokeless tobacco.  Her alcohol and drug histories are not on file.  She drinks alcohol socially.  She is divorced.  She was late to her clinic appointment today as she had to bring her child to the cardiologist.  She lives in [place].  The patient is alone today.  Allergies: No Known Allergies  Current Medications: Current Outpatient Prescriptions  Medication Sig Dispense Refill  . cetirizine (ZYRTEC) 10 MG tablet Take 10 mg by mouth daily.    . enalapril (VASOTEC) 2.5 MG tablet Take 2.5 mg by mouth daily.    . ferrous sulfate 325 (65 FE) MG tablet Take 1 tablet (325 mg total) by mouth 2 (two) times daily with a meal. 60 tablet 0  . levothyroxine (SYNTHROID) 50 MCG tablet Take 1 tablet (50 mcg total) by mouth daily before breakfast. 30 tablet 0  . levothyroxine (SYNTHROID, LEVOTHROID) 125 MCG tablet Take 125 mcg by mouth daily.    . meclizine (ANTIVERT) 12.5 MG tablet Take 12.5 mg by mouth 3 (three) times daily.    Marland Kitchen topiramate (TOPAMAX) 25 MG tablet Take 25 mg by mouth 2 (two) times daily.    Marland Kitchen albuterol (PROVENTIL HFA;VENTOLIN HFA) 108 (90 Base) MCG/ACT inhaler Inhale 2 puffs into the lungs every 6 (six) hours as needed.     No current facility-administered medications for this visit.     Review of Systems:  GENERAL:  Feels "good".  No fevers  or sweats.  Weight down 2 pounds. PERFORMANCE STATUS (ECOG):  1 HEENT:  Mole behind left eye (eye weak; sees dots).  No runny nose, sore throat, mouth sores or tenderness. Lungs:  Shortness of breath on exertion.  No cough.  No hemoptysis. Cardiac:  No chest pain, palpitations, orthopnea, or PND. GI:  Stool not solid x 1 year.  No nausea, vomiting, diarrhea, constipation, melena or hematochezia. GU:  Trouble holding urine s/p 3 C sections.  No urgency, frequency, dysuria, or hematuria. Musculoskeletal:  Legs hurt (left knee and hips hurt if sits too long).  No back pain.  No joint pain.  No muscle tenderness. Extremities:  No pain or swelling. Skin:  Unexplained  bruises (chronic).  No rashes or skin changes. Neuro:  Migraine headaches.  No numbness or weakness, balance or coordination issues. Endocrine:  No diabetes.  Hypothyroid on Synthroid.  No hot flashes or night sweats. Psych:  No mood changes, depression or anxiety. Pain:  No focal pain. Review of systems:  All other systems reviewed and found to be negative.  Physical Exam: Blood pressure 123/85, pulse 80, temperature 97.9 F (36.6 C), resp. rate 18, weight 201 lb 15.1 oz (91.6 kg). GENERAL:  Well developed, well nourished, woman sitting comfortably in the exam room in no acute distress. MENTAL STATUS:  Alert and oriented to person, place and time. HEAD:  Dark brown hair.  Normocephalic, atraumatic, face symmetric, no Cushingoid features. EYES:  Pupils equal round and reactive to light and accomodation.  No conjunctivitis or scleral icterus. ENT: Right upper lip piercing.  Oropharynx clear without lesion.  Tongue normal. Mucous membranes moist.  RESPIRATORY:  Clear to auscultation without rales, wheezes or rhonchi. CARDIOVASCULAR:  Regular rate and rhythm without murmur, rub or gallop. ABDOMEN:  Soft, non-tender, with active bowel sounds, and no hepatosplenomegaly.  No masses. SKIN:  Tattoo.  No petechiae.  No rashes, ulcers or lesions. EXTREMITIES: No edema, no skin discoloration or tenderness.  No palpable cords. LYMPH NODES: No palpable cervical, supraclavicular, axillary or inguinal adenopathy  NEUROLOGICAL: Unremarkable. PSYCH:  Appropriate.   Appointment on 06/16/2017  Component Date Value Ref Range Status  . WBC 06/16/2017 3.3* 3.6 - 11.0 K/uL Final  . RBC 06/16/2017 3.89  3.80 - 5.20 MIL/uL Final  . Hemoglobin 06/16/2017 10.8* 12.0 - 16.0 g/dL Final  . HCT 06/16/2017 33.7* 35.0 - 47.0 % Final  . MCV 06/16/2017 86.7  80.0 - 100.0 fL Final  . MCH 06/16/2017 27.7  26.0 - 34.0 pg Final  . MCHC 06/16/2017 32.0  32.0 - 36.0 g/dL Final  . RDW 06/16/2017 23.8* 11.5 - 14.5 % Final   . Platelets 06/16/2017 230  150 - 440 K/uL Final  . Neutrophils Relative % 06/16/2017 42  % Final  . Neutro Abs 06/16/2017 1.4  1.4 - 6.5 K/uL Final  . Lymphocytes Relative 06/16/2017 45  % Final  . Lymphs Abs 06/16/2017 1.5  1.0 - 3.6 K/uL Final  . Monocytes Relative 06/16/2017 9  % Final  . Monocytes Absolute 06/16/2017 0.3  0.2 - 0.9 K/uL Final  . Eosinophils Relative 06/16/2017 2  % Final  . Eosinophils Absolute 06/16/2017 0.1  0 - 0.7 K/uL Final  . Basophils Relative 06/16/2017 2  % Final  . Basophils Absolute 06/16/2017 0.1  0 - 0.1 K/uL Final  . Ferritin 06/16/2017 25  11 - 307 ng/mL Final    Assessment:  Katherine Brennan is a 51 y.o. female with iron deficiency anemia secondary  to menorrhagia.  She denies any melena, hematochezia or hematuria.  Diet is good.  She has been on oral iron BID x 1 year.  She has a history of ice pica.  She received 2 units of PRBCs on 08/29/2015 for menorrhagia.  She has received IV iron 2 years ago at [facility].  Work-up on 04/15/2017 revealed a hematocrit of 28.6, hemoglobin 9.0, MCV 79.7, platelets 245,000, white count 4400 with an ANC of 2300.  Ferritin was 4. One of 3 cards were guaiac positive in 04/2017.  Ferritin has been followed: 5 on 01/02/2009, 20 on 04/04/2009, 8 on 10/25/2009, 4 on 04/15/2017, 18 on 05/13/2017, and 25 on 06/16/2017.  Ferritin goal is 100.  She received Venofer weekly x 4 (04/29/2017 - 05/27/2017).  She has a history of a monoclonal gammopathy of unknown significance.  SPEP on 04/15/2017 revealed a 0.5 g/dL IgG monoclonal protein with kappa light chain specificity.  IgG was 1713 ([id number]).  Free light chains ratio was 1.71 (0.26-1.65).  24 hour UPEP on 05/01/2017 was negative.  SPEP on 09/27/2015 revealed an M spike of 0.5 g/dL.   M-spike has been followed: 0.5 gm/dL on 09/27/2015 and 0.5 gm/dL on 04/15/2017.  She bruises easily.  She denies any excess bleeding with surgeries.  She does not take aspirin or  ibuprofen.  She denies any family history of bleeding.  Work-up on 04/15/2017 revealed the following normal studies:  PT/INR, von Willebrand panel, platelet function assay.  Symptomatically, she feels good.  Exam is unremarkable.  Hemoglobin has improved from 9.0 to 10.8.  Ferritin has increased from 4 to 25 after 4 doses of IV Venofer.   Plan: 1.  Review labs from yesterday. 2.  Discuss UPEP results. Likely MGUS.  Discuss plan to monitor monoclonal protein every 6 months.  3.  Discuss GI evaluation for iron deficiency anemia; 1 of 3 guaiac + stools; change in stool quality. Sees Dr. Allen Norris on 06/18/2017. 4.  Patient still having heavy menses. She needs to be seen by gynecology. Called Dr. Iona Beard to let her know; PCP to refer.  4.  Ferritin remains low despite IV replacement (4 doses); ferritin 25.  We have set a goal of 100 for this patient. Venofer today, then weekly x 2 5.  RTC in 6 weeks for labs (CBC with diff, ferritin) 6.  RTC in 3 months for MD assessment, labs (CBC with diff, ferritin - the day before), and +/- Venofer   Honor Loh, NP  06/17/2017, 1:52 PM   I saw and evaluated the patient, participating in the key portions of the service and reviewing pertinent diagnostic studies and records.  I reviewed the nurse practitioner's note and agree with the findings and the plan.  The assessment and plan were discussed with the patient. A few questions were asked by the patient and answered.   Lequita Asal, MD 06/17/2017

## 2017-06-17 ENCOUNTER — Inpatient Hospital Stay (HOSPITAL_BASED_OUTPATIENT_CLINIC_OR_DEPARTMENT_OTHER): Payer: Medicaid Other | Admitting: Hematology and Oncology

## 2017-06-17 ENCOUNTER — Inpatient Hospital Stay: Payer: Medicaid Other

## 2017-06-17 ENCOUNTER — Other Ambulatory Visit: Payer: Self-pay | Admitting: *Deleted

## 2017-06-17 VITALS — BP 123/85 | HR 80 | Temp 97.9°F | Resp 18 | Wt 201.9 lb

## 2017-06-17 DIAGNOSIS — D500 Iron deficiency anemia secondary to blood loss (chronic): Secondary | ICD-10-CM

## 2017-06-17 DIAGNOSIS — N920 Excessive and frequent menstruation with regular cycle: Secondary | ICD-10-CM | POA: Diagnosis not present

## 2017-06-17 DIAGNOSIS — Z79899 Other long term (current) drug therapy: Secondary | ICD-10-CM | POA: Diagnosis not present

## 2017-06-17 DIAGNOSIS — G2581 Restless legs syndrome: Secondary | ICD-10-CM

## 2017-06-17 DIAGNOSIS — F17210 Nicotine dependence, cigarettes, uncomplicated: Secondary | ICD-10-CM

## 2017-06-17 DIAGNOSIS — D472 Monoclonal gammopathy: Secondary | ICD-10-CM

## 2017-06-17 DIAGNOSIS — E039 Hypothyroidism, unspecified: Secondary | ICD-10-CM

## 2017-06-17 DIAGNOSIS — D509 Iron deficiency anemia, unspecified: Secondary | ICD-10-CM

## 2017-06-17 DIAGNOSIS — F329 Major depressive disorder, single episode, unspecified: Secondary | ICD-10-CM

## 2017-06-17 DIAGNOSIS — F5089 Other specified eating disorder: Secondary | ICD-10-CM

## 2017-06-17 MED ORDER — IRON SUCROSE 20 MG/ML IV SOLN
200.0000 mg | Freq: Once | INTRAVENOUS | Status: AC
  Administered 2017-06-17: 200 mg via INTRAVENOUS
  Filled 2017-06-17: qty 10

## 2017-06-17 MED ORDER — SODIUM CHLORIDE 0.9 % IV SOLN
Freq: Once | INTRAVENOUS | Status: AC
  Administered 2017-06-17: 14:00:00 via INTRAVENOUS
  Filled 2017-06-17: qty 1000

## 2017-06-17 NOTE — Patient Instructions (Signed)

## 2017-06-17 NOTE — Progress Notes (Signed)
Patient states she having problems with her right hand going to sleep.  Left eye waters a lot. Otherwise, no complaints.

## 2017-06-18 ENCOUNTER — Other Ambulatory Visit: Payer: Self-pay

## 2017-06-18 ENCOUNTER — Encounter: Payer: Self-pay | Admitting: Gastroenterology

## 2017-06-18 ENCOUNTER — Ambulatory Visit (INDEPENDENT_AMBULATORY_CARE_PROVIDER_SITE_OTHER): Payer: Medicaid Other | Admitting: Gastroenterology

## 2017-06-18 VITALS — BP 113/71 | HR 83 | Temp 98.3°F | Ht 65.0 in | Wt 206.0 lb

## 2017-06-18 DIAGNOSIS — D500 Iron deficiency anemia secondary to blood loss (chronic): Secondary | ICD-10-CM

## 2017-06-18 DIAGNOSIS — K921 Melena: Secondary | ICD-10-CM

## 2017-06-18 NOTE — Progress Notes (Signed)
Gastroenterology Consultation  Referring Provider:     Lequita Asal, MD Primary Care Physician:  Sharyne Peach, MD Primary Gastroenterologist:  Dr. Allen Norris     Reason for Consultation:     Iron deficiency anemia        HPI:   Katherine Brennan is a 51 y.o. y/o female referred for consultation & management of Iron deficiency anemia by Dr. Iona Beard, Rubbie Battiest, MD.  This patient comes in today with a history of iron deficiency anemia.  The patient states that over the last year or so she has had much heavier menstrual cycles.  She also reports that she had stool tests sent off with only 1 of the stool test being positive for blood with 2 being negative.  The patient denies any unexplained weight loss fevers chills nausea or vomiting.  The patient also denies any black stools or bloody stools.  There is no family history of colon cancer colon polyps.  She reports that she has never had a colonoscopy in the past.  She says that when she has her menstrual cycle they usually last for 9 days and she passes a large amount of clots.  Past Medical History:  Diagnosis Date  . Anemia   . Depression   . Hypothyroidism   . Memory changes   . Migraines   . Vision changes     Past Surgical History:  Procedure Laterality Date  . [place], 2007    Prior to Admission medications   Medication Sig Start Date End Date Taking? Authorizing Provider  enalapril (VASOTEC) 2.5 MG tablet Take 2.5 mg by mouth daily. 07/16/16  Yes [provider]  levothyroxine (SYNTHROID, LEVOTHROID) 125 MCG tablet Take 125 mcg by mouth daily. 03/26/17 03/26/18 Yes [provider]  meclizine (ANTIVERT) 12.5 MG tablet Take 12.5 mg by mouth 3 (three) times daily.   Yes [provider]  topiramate (TOPAMAX) 25 MG tablet Take 25 mg by mouth 2 (two) times daily. 03/25/17 03/25/18 Yes [provider]  albuterol (PROVENTIL HFA;VENTOLIN HFA) 108 (90 Base) MCG/ACT inhaler Inhale 2  puffs into the lungs every 6 (six) hours as needed. 05/14/16 05/14/17  [provider]  cetirizine (ZYRTEC) 10 MG tablet Take 10 mg by mouth daily. 05/14/16 06/17/17  [provider]  ferrous sulfate 325 (65 FE) MG tablet Take 1 tablet (325 mg total) by mouth 2 (two) times daily with a meal. Patient not taking: Reported on 06/18/2017 08/29/15   Vaughan Basta, MD  levothyroxine (SYNTHROID) 50 MCG tablet Take 1 tablet (50 mcg total) by mouth daily before breakfast. Patient not taking: Reported on 06/18/2017 08/29/15   Vaughan Basta, MD    Family History  Problem Relation Age of Onset  . CAD Father   . Breast cancer Neg Hx      Social History  Substance Use Topics  . Smoking status: Former Smoker    Types: Cigarettes  . Smokeless tobacco: Never Used  . Alcohol use No    Allergies as of 06/18/2017  . (No Known Allergies)    Review of Systems:    All systems reviewed and negative except where noted in HPI.   Physical Exam:  BP 113/71   Pulse 83   Temp 98.3 F (36.8 C) (Oral)   Ht 5\' 5"  (1.651 m)   Wt 206 lb (93.4 kg)   BMI 34.28 kg/m  No LMP recorded. Psych:  Alert and cooperative. Normal mood and  affect. General:   Alert,  Well-developed, well-nourished, pleasant and cooperative in NAD Head:  Normocephalic and atraumatic. Eyes:  Sclera clear, no icterus.   Conjunctiva pink. Ears:  Normal auditory acuity. Nose:  No deformity, discharge, or lesions. Mouth:  No deformity or lesions,oropharynx pink & moist. Neck:  Supple; no masses or thyromegaly. Lungs:  Respirations even and unlabored.  Clear throughout to auscultation.   No wheezes, crackles, or rhonchi. No acute distress. Heart:  Regular rate and rhythm; no murmurs, clicks, rubs, or gallops. Abdomen:  Normal bowel sounds.  No bruits.  Soft, non-tender and non-distended without masses, hepatosplenomegaly or hernias noted.  No guarding or rebound tenderness.  Negative Carnett sign.   Rectal:   Deferred.  Msk:  Symmetrical without gross deformities.  Good, equal movement & strength bilaterally. Pulses:  Normal pulses noted. Extremities:  No clubbing or edema.  No cyanosis. Neurologic:  Alert and oriented x3;  grossly normal neurologically. Skin:  Intact without significant lesions or rashes.  No jaundice. Lymph Nodes:  No significant cervical adenopathy. Psych:  Alert and cooperative. Normal mood and affect.  Imaging Studies: No results found.  Assessment and Plan:   Katherine Brennan is a 51 y.o. y/o female who comes in for iron deficiency anemia who reports that she has heavy Bleeding with her menstrual cycle. The patient was also found to have heme positive stools.  The patient will be set up for an EGD and colonoscopy to rule out any GI cause for her iron deficiency anemia. I have discussed risks & benefits which include, but are not limited to, bleeding, infection, perforation & drug reaction.  The patient agrees with this plan & written consent will be obtained.    Lucilla Lame, MD. Marval Regal   Note: This dictation was prepared with Dragon dictation along with smaller phrase technology. Any transcriptional errors that result from this process are unintentional.

## 2017-06-22 ENCOUNTER — Encounter: Payer: Self-pay | Admitting: *Deleted

## 2017-06-22 ENCOUNTER — Encounter: Payer: Self-pay | Admitting: Hematology and Oncology

## 2017-06-22 NOTE — Discharge Instructions (Signed)
General Anesthesia, Adult, Care After °These instructions provide you with information about caring for yourself after your procedure. Your health care provider may also give you more specific instructions. Your treatment has been planned according to current medical practices, but problems sometimes occur. Call your health care provider if you have any problems or questions after your procedure. °What can I expect after the procedure? °After the procedure, it is common to have: °· Vomiting. °· A sore throat. °· Mental slowness. ° °It is common to feel: °· Nauseous. °· Cold or shivery. °· Sleepy. °· Tired. °· Sore or achy, even in parts of your body where you did not have surgery. ° °Follow these instructions at home: °For at least 24 hours after the procedure: °· Do not: °? Participate in activities where you could fall or become injured. °? Drive. °? Use heavy machinery. °? Drink alcohol. °? Take sleeping pills or medicines that cause drowsiness. °? Make important decisions or sign legal documents. °? Take care of children on your own. °· Rest. °Eating and drinking °· If you vomit, drink water, juice, or soup when you can drink without vomiting. °· Drink enough fluid to keep your urine clear or pale yellow. °· Make sure you have little or no nausea before eating solid foods. °· Follow the diet recommended by your health care provider. °General instructions °· Have a responsible adult stay with you until you are awake and alert. °· Return to your normal activities as told by your health care provider. Ask your health care provider what activities are safe for you. °· Take over-the-counter and prescription medicines only as told by your health care provider. °· If you smoke, do not smoke without supervision. °· Keep all follow-up visits as told by your health care provider. This is important. °Contact a health care provider if: °· You continue to have nausea or vomiting at home, and medicines are not helpful. °· You  cannot drink fluids or start eating again. °· You cannot urinate after 8-12 hours. °· You develop a skin rash. °· You have fever. °· You have increasing redness at the site of your procedure. °Get help right away if: °· You have difficulty breathing. °· You have chest pain. °· You have unexpected bleeding. °· You feel that you are having a life-threatening or urgent problem. °This information is not intended to replace advice given to you by your health care provider. Make sure you discuss any questions you have with your health care provider. °Document Released: 12/29/2000 Document Revised: 02/25/2016 Document Reviewed: 09/06/2015 °Elsevier Interactive Patient Education © 2018 [employer] Inc. ° °

## 2017-06-24 ENCOUNTER — Inpatient Hospital Stay: Payer: Medicaid Other

## 2017-06-25 ENCOUNTER — Ambulatory Visit: Payer: Medicaid Other | Admitting: Anesthesiology

## 2017-06-25 ENCOUNTER — Ambulatory Visit
Admission: RE | Admit: 2017-06-25 | Discharge: 2017-06-25 | Disposition: A | Discharge status: Home / Self Care | Payer: Medicaid Other | Source: Ambulatory Visit | Attending: Gastroenterology | Admitting: Gastroenterology

## 2017-06-25 ENCOUNTER — Encounter
Admission: RE | Disposition: A | Discharge status: Home / Self Care | Payer: Self-pay | Source: Ambulatory Visit | Attending: Gastroenterology

## 2017-06-25 DIAGNOSIS — I256 Silent myocardial ischemia: Secondary | ICD-10-CM | POA: Diagnosis not present

## 2017-06-25 DIAGNOSIS — D509 Iron deficiency anemia, unspecified: Secondary | ICD-10-CM | POA: Insufficient documentation

## 2017-06-25 DIAGNOSIS — F329 Major depressive disorder, single episode, unspecified: Secondary | ICD-10-CM | POA: Diagnosis not present

## 2017-06-25 DIAGNOSIS — D123 Benign neoplasm of transverse colon: Secondary | ICD-10-CM | POA: Diagnosis not present

## 2017-06-25 DIAGNOSIS — Z87891 Personal history of nicotine dependence: Secondary | ICD-10-CM | POA: Insufficient documentation

## 2017-06-25 DIAGNOSIS — K3189 Other diseases of stomach and duodenum: Secondary | ICD-10-CM

## 2017-06-25 DIAGNOSIS — D500 Iron deficiency anemia secondary to blood loss (chronic): Secondary | ICD-10-CM

## 2017-06-25 DIAGNOSIS — M1990 Unspecified osteoarthritis, unspecified site: Secondary | ICD-10-CM | POA: Insufficient documentation

## 2017-06-25 DIAGNOSIS — E039 Hypothyroidism, unspecified: Secondary | ICD-10-CM | POA: Insufficient documentation

## 2017-06-25 DIAGNOSIS — Z79899 Other long term (current) drug therapy: Secondary | ICD-10-CM | POA: Diagnosis not present

## 2017-06-25 HISTORY — PX: POLYPECTOMY: SHX5525

## 2017-06-25 HISTORY — DX: Silent myocardial ischemia: I25.6

## 2017-06-25 HISTORY — PX: COLONOSCOPY WITH PROPOFOL: SHX5780

## 2017-06-25 HISTORY — DX: Unspecified osteoarthritis, unspecified site: M19.90

## 2017-06-25 HISTORY — PX: ESOPHAGOGASTRODUODENOSCOPY (EGD) WITH PROPOFOL: SHX5813

## 2017-06-25 SURGERY — COLONOSCOPY WITH PROPOFOL
Anesthesia: General

## 2017-06-25 MED ORDER — LACTATED RINGERS IV SOLN
10.0000 mL/h | INTRAVENOUS | Status: DC
  Administered 2017-06-25: 10 mL/h via INTRAVENOUS

## 2017-06-25 MED ORDER — ACETAMINOPHEN 325 MG PO TABS: 325.0000 mg | ORAL_TABLET | ORAL | Status: DC | PRN

## 2017-06-25 MED ORDER — ONDANSETRON HCL 4 MG/2ML IJ SOLN: 4.0000 mg | Freq: Once | INTRAMUSCULAR | Status: DC | PRN

## 2017-06-25 MED ORDER — LIDOCAINE HCL (CARDIAC) 20 MG/ML IV SOLN
INTRAVENOUS | Status: DC | PRN
  Administered 2017-06-25: 50 mg via INTRAVENOUS

## 2017-06-25 MED ORDER — PROPOFOL 10 MG/ML IV BOLUS
INTRAVENOUS | Status: DC | PRN
  Administered 2017-06-25: 20 mg via INTRAVENOUS
  Administered 2017-06-25: 30 mg via INTRAVENOUS
  Administered 2017-06-25 (×3): 20 mg via INTRAVENOUS
  Administered 2017-06-25: 40 mg via INTRAVENOUS
  Administered 2017-06-25: 100 mg via INTRAVENOUS
  Administered 2017-06-25: 50 mg via INTRAVENOUS
  Administered 2017-06-25: 40 mg via INTRAVENOUS
  Administered 2017-06-25 (×2): 30 mg via INTRAVENOUS

## 2017-06-25 MED ORDER — GLYCOPYRROLATE 0.2 MG/ML IJ SOLN
INTRAMUSCULAR | Status: DC | PRN
  Administered 2017-06-25: 0.1 mg via INTRAVENOUS

## 2017-06-25 MED ORDER — SIMETHICONE 40 MG/0.6ML PO SUSP
ORAL | Status: DC | PRN
  Administered 2017-06-25: 10:00:00

## 2017-06-25 MED ORDER — ACETAMINOPHEN 160 MG/5ML PO SOLN: 325.0000 mg | ORAL | Status: DC | PRN

## 2017-06-25 SURGICAL SUPPLY — 35 items
BALLN DILATOR 10-12 8 (BALLOONS)
BALLN DILATOR 12-15 8 (BALLOONS)
BALLN DILATOR 15-18 8 (BALLOONS)
BALLN DILATOR CRE 0-12 8 (BALLOONS)
BALLN DILATOR ESOPH 8 10 CRE (MISCELLANEOUS) IMPLANT
BALLOON DILATOR 12-15 8 (BALLOONS) IMPLANT
BALLOON DILATOR 15-18 8 (BALLOONS) IMPLANT
BALLOON DILATOR CRE 0-12 8 (BALLOONS) IMPLANT
BLOCK BITE 60FR ADLT L/F GRN (MISCELLANEOUS) ×3 IMPLANT
CANISTER SUCT 1200ML W/VALVE (MISCELLANEOUS) ×3 IMPLANT
CLIP HMST 235XBRD CATH ROT (MISCELLANEOUS) ×2 IMPLANT
CLIP RESOLUTION 360 11X235 (MISCELLANEOUS) ×4
FCP ESCP3.2XJMB 240X2.8X (MISCELLANEOUS)
FORCEPS BIOP RAD 4 LRG CAP 4 (CUTTING FORCEPS) ×3 IMPLANT
FORCEPS BIOP RJ4 240 W/NDL (MISCELLANEOUS)
FORCEPS ESCP3.2XJMB 240X2.8X (MISCELLANEOUS) IMPLANT
GOWN CVR UNV OPN BCK APRN NK (MISCELLANEOUS) ×2 IMPLANT
GOWN ISOL THUMB LOOP REG UNIV (MISCELLANEOUS) ×4
INJECTOR VARIJECT VIN23 (MISCELLANEOUS) IMPLANT
KIT DEFENDO VALVE AND CONN (KITS) IMPLANT
KIT ENDO PROCEDURE OLY (KITS) ×3 IMPLANT
MARKER SPOT ENDO TATTOO 5ML (MISCELLANEOUS) IMPLANT
PAD GROUND ADULT SPLIT (MISCELLANEOUS) ×3 IMPLANT
PROBE APC STR FIRE (PROBE) IMPLANT
RETRIEVER NET PLAT FOOD (MISCELLANEOUS) IMPLANT
RETRIEVER NET ROTH 2.5X230 LF (MISCELLANEOUS) IMPLANT
SNARE SHORT THROW 13M SML OVAL (MISCELLANEOUS) IMPLANT
SNARE SHORT THROW 30M LRG OVAL (MISCELLANEOUS) IMPLANT
SNARE SNG USE RND 15MM (INSTRUMENTS) IMPLANT
SPOT EX ENDOSCOPIC TATTOO (MISCELLANEOUS)
SYR INFLATION 60ML (SYRINGE) IMPLANT
TRAP ETRAP POLY (MISCELLANEOUS) ×3 IMPLANT
VARIJECT INJECTOR VIN23 (MISCELLANEOUS)
WATER STERILE IRR 250ML POUR (IV SOLUTION) ×3 IMPLANT
WIRE CRE 18-20MM 8CM F G (MISCELLANEOUS) IMPLANT

## 2017-06-25 NOTE — Op Note (Signed)
[facility] Gastroenterology Patient Name: Katherine Brennan Procedure Date: 06/25/2017 10:11 AM MRN: [id number] Account #: [id number] Date of Birth: [birth year] Admit Type: Outpatient Age: 51 Room: [facility] OR ROOM 01 Gender: Female Note Status: Finalized Procedure:            Colonoscopy Indications:          Iron deficiency anemia Providers:            Lucilla Lame MD, MD Referring MD:         Salome Holmes (Referring MD) Medicines:            Propofol per Anesthesia Complications:        No immediate complications. Procedure:            Pre-Anesthesia Assessment:                       - Prior to the procedure, a History and Physical was                        performed, and patient medications and allergies were                        reviewed. The patient's tolerance of previous                        anesthesia was also reviewed. The risks and benefits of                        the procedure and the sedation options and risks were                        discussed with the patient. All questions were                        answered, and informed consent was obtained. Prior                        Anticoagulants: The patient has taken no previous                        anticoagulant or antiplatelet agents. ASA Grade                        Assessment: II - A patient with mild systemic disease.                        After reviewing the risks and benefits, the patient was                        deemed in satisfactory condition to undergo the                        procedure.                       After obtaining informed consent, the colonoscope was                        passed under direct vision. Throughout the procedure,  the patient's blood pressure, pulse, and oxygen                        saturations were monitored continuously. The Olympus CF                        H180AL Colonoscope (S#: [id number]) was introduced through   the anus and advanced to the the cecum, identified by                        appendiceal orifice and ileocecal valve. The                        colonoscopy was performed without difficulty. The                        patient tolerated the procedure well. The quality of                        the bowel preparation was excellent. Findings:      The perianal and digital rectal examinations were normal.      A 10 mm polyp was found in the transverse colon. The polyp was sessile.       The polyp was removed with a hot snare. Resection and retrieval were       complete. To prevent bleeding post-intervention, two hemostatic clips       were successfully placed (MR conditional). There was no bleeding at the       end of the procedure. Impression:           - One 10 mm polyp in the transverse colon, removed with                        a hot snare. Resected and retrieved. Clips (MR                        conditional) were placed. Recommendation:       - Discharge patient to home.                       - Resume previous diet.                       - Continue present medications.                       - Await pathology results.                       - Repeat colonoscopy in 3 years if polyp adenoma and 10                        years if hyperplastic Procedure Code(s):    --- Professional ---                       [phone], Colonoscopy, flexible; with removal of tumor(s),                        polyp(s), or other lesion(s) by snare technique Diagnosis Code(s):    --- Professional ---  D50.9, Iron deficiency anemia, unspecified                       D12.3, Benign neoplasm of transverse colon (hepatic                        flexure or splenic flexure) CPT copyright 2016 American Medical Association. All rights reserved. The codes documented in this report are preliminary and upon coder review may  be revised to meet current compliance requirements. Lucilla Lame MD, MD 06/25/2017  10:35:16 AM This report has been signed electronically. Number of Addenda: 0 Note Initiated On: 06/25/2017 10:11 AM Scope Withdrawal Time: 0 hours 10 minutes 29 seconds  Total Procedure Duration: 0 hours 19 minutes 46 seconds       [facility]

## 2017-06-25 NOTE — Transfer of Care (Signed)
Immediate Anesthesia Transfer of Care Note  Patient: Katherine Brennan  Procedure(s) Performed: Procedure(s): COLONOSCOPY WITH PROPOFOL (N/A) ESOPHAGOGASTRODUODENOSCOPY (EGD) WITH PROPOFOL (N/A)  Patient Location: PACU  Anesthesia Type: General  Level of Consciousness: awake, alert  and patient cooperative  Airway and Oxygen Therapy: Patient Spontanous Breathing and Patient connected to supplemental oxygen  Post-op Assessment: Post-op Vital signs reviewed, Patient's Cardiovascular Status Stable, Respiratory Function Stable, Patent Airway and No signs of Nausea or vomiting  Post-op Vital Signs: Reviewed and stable  Complications: No apparent anesthesia complications

## 2017-06-25 NOTE — Anesthesia Preprocedure Evaluation (Signed)
Anesthesia Evaluation  Patient identified by MRN, date of birth, ID band Patient awake    Reviewed: Allergy & Precautions, NPO status   Airway Mallampati: II  TM Distance: >3 FB     Dental   Pulmonary former smoker,    breath sounds clear to auscultation       Cardiovascular + DOE   Rhythm:Regular Rate:Normal     Neuro/Psych  Headaches, PSYCHIATRIC DISORDERS Depression    GI/Hepatic   Endo/Other  Hypothyroidism BMI 33  Renal/GU      Musculoskeletal  (+) Arthritis ,   Abdominal   Peds  Hematology  (+) anemia , Receives iron infusions for anemia   Anesthesia Other Findings   Reproductive/Obstetrics                           Anesthesia Physical Anesthesia Plan  ASA: II  Anesthesia Plan: General   Post-op Pain Management:    Induction:   PONV Risk Score and Plan:   Airway Management Planned: Natural Airway and Nasal Cannula  Additional Equipment:   Intra-op Plan:   Post-operative Plan:   Informed Consent: I have reviewed the patients History and Physical, chart, labs and discussed the procedure including the risks, benefits and alternatives for the proposed anesthesia with the patient or authorized representative who has indicated his/her understanding and acceptance.     Plan Discussed with: CRNA  Anesthesia Plan Comments:        Anesthesia Quick Evaluation

## 2017-06-25 NOTE — H&P (Signed)
Katherine Lame, MD [facility] [address], [place], [place] [address] Phone:[phone] Fax : [phone]  Primary Care Physician:  Sharyne Peach, MD Primary Gastroenterologist:  Dr. Allen Norris  Pre-Procedure History & Physical: HPI:  Katherine Brennan is a 51 y.o. female is here for an endoscopy and colonoscopy.   Past Medical History:  Diagnosis Date  . Anemia   . Arthritis   . Depression   . Hypothyroidism   . Memory changes   . Migraines   . Silent myocardial ischemia   . Vision changes     Past Surgical History:  Procedure Laterality Date  . [place], 2007    Prior to Admission medications   Medication Sig Start Date End Date Taking? Authorizing Provider  cetirizine (ZYRTEC) 10 MG tablet Take 10 mg by mouth daily. 05/14/16 06/22/17 Yes [provider]  enalapril (VASOTEC) 2.5 MG tablet Take 2.5 mg by mouth daily. 07/16/16  Yes [provider]  levothyroxine (SYNTHROID) 50 MCG tablet Take 1 tablet (50 mcg total) by mouth daily before breakfast. 08/29/15  Yes Vaughan Basta, MD  levothyroxine (SYNTHROID, LEVOTHROID) 125 MCG tablet Take 125 mcg by mouth daily. 03/26/17 03/26/18 Yes [provider]  meclizine (ANTIVERT) 12.5 MG tablet Take 12.5 mg by mouth 3 (three) times daily.   Yes [provider]  topiramate (TOPAMAX) 25 MG tablet Take 25 mg by mouth 2 (two) times daily. 03/25/17 03/25/18 Yes [provider]  albuterol (PROVENTIL HFA;VENTOLIN HFA) 108 (90 Base) MCG/ACT inhaler Inhale 2 puffs into the lungs every 6 (six) hours as needed. 05/14/16 05/14/17  [provider]  ferrous sulfate 325 (65 FE) MG tablet Take 1 tablet (325 mg total) by mouth 2 (two) times daily with a meal. Patient not taking: Reported on 06/18/2017 08/29/15   Vaughan Basta, MD    Allergies as of 06/18/2017  . (No Known Allergies)    Family History  Problem Relation Age of Onset  . CAD Father   . Breast cancer Neg  Hx     Social History   Social History  . Marital status: Single    Spouse name: N/A  . Number of children: N/A  . Years of education: N/A   Occupational History  . Not on file.   Social History Main Topics  . Smoking status: Former Smoker    Types: Cigarettes    Quit date: 2008  . Smokeless tobacco: Never Used  . Alcohol use Yes     Comment: 1x/month  . Drug use: No  . Sexual activity: Not on file   Other Topics Concern  . Not on file   Social History Narrative  . No narrative on file    Review of Systems: See HPI, otherwise negative ROS  Physical Exam: BP 117/83   Pulse 98   Temp (!) 97.3 F (36.3 C)   Resp 16   Ht 5\' 5"  (1.651 m)   Wt 197 lb (89.4 kg)   LMP 06/08/2017 (Exact Date)   SpO2 98%   BMI 32.78 kg/m  General:   Alert,  pleasant and cooperative in NAD Head:  Normocephalic and atraumatic. Neck:  Supple; no masses or thyromegaly. Lungs:  Clear throughout to auscultation.    Heart:  Regular rate and rhythm. Abdomen:  Soft, nontender and nondistended. Normal bowel sounds, without guarding, and without rebound.   Neurologic:  Alert and  oriented x4;  grossly normal neurologically.  Impression/Plan: Katherine Brennan is here for  an endoscopy and colonoscopy to be performed for IDA  Risks, benefits, limitations, and alternatives regarding  endoscopy and colonoscopy have been reviewed with the patient.  Questions have been answered.  All parties agreeable.   Katherine Lame, MD  06/25/2017, 9:49 AM

## 2017-06-25 NOTE — Anesthesia Postprocedure Evaluation (Signed)
Anesthesia Post Note  Patient: Katherine Brennan  Procedure(s) Performed: Procedure(s) (LRB): COLONOSCOPY WITH PROPOFOL (N/A) ESOPHAGOGASTRODUODENOSCOPY (EGD) WITH PROPOFOL (N/A) POLYPECTOMY (N/A)  Patient location during evaluation: PACU Anesthesia Type: General Level of consciousness: awake Pain management: pain level controlled Vital Signs Assessment: post-procedure vital signs reviewed and stable Respiratory status: respiratory function stable Cardiovascular status: stable Postop Assessment: no signs of nausea or vomiting Anesthetic complications: no    Veda Canning

## 2017-06-25 NOTE — Anesthesia Procedure Notes (Signed)
Date/Time: 06/25/2017 10:02 AM Performed by: Cameron Ali Pre-anesthesia Checklist: Patient identified, Emergency Drugs available, Suction available, Timeout performed and Patient being monitored Patient Re-evaluated:Patient Re-evaluated prior to induction Oxygen Delivery Method: Nasal cannula Placement Confirmation: positive ETCO2

## 2017-06-25 NOTE — Op Note (Signed)
[facility] Gastroenterology Patient Name: Katherine Brennan Procedure Date: 06/25/2017 10:00 AM MRN: [id number] Account #: [id number] Date of Birth: 13-May-1966 Admit Type: Outpatient Age: 51 Room: [facility] OR ROOM 01 Gender: Female Note Status: Finalized Procedure:            Upper GI endoscopy Indications:          Iron deficiency anemia Providers:            Lucilla Lame MD, MD Referring MD:         Salome Holmes (Referring MD) Medicines:            Propofol per Anesthesia Complications:        No immediate complications. Procedure:            Pre-Anesthesia Assessment:                       - Prior to the procedure, a History and Physical was                        performed, and patient medications and allergies were                        reviewed. The patient's tolerance of previous                        anesthesia was also reviewed. The risks and benefits of                        the procedure and the sedation options and risks were                        discussed with the patient. All questions were                        answered, and informed consent was obtained. Prior                        Anticoagulants: The patient has taken no previous                        anticoagulant or antiplatelet agents. ASA Grade                        Assessment: II - A patient with mild systemic disease.                        After reviewing the risks and benefits, the patient was                        deemed in satisfactory condition to undergo the                        procedure.                       After obtaining informed consent, the endoscope was                        passed under direct vision. Throughout the procedure,  the patient's blood pressure, pulse, and oxygen                        saturations were monitored continuously. The Olympus                        190 Endoscope [id number]) was introduced through the   mouth, and advanced to the second part of duodenum. The                        upper GI endoscopy was accomplished without difficulty.                        The patient tolerated the procedure well. Findings:      The examined esophagus was normal.      A single localized, non-bleeding erosion was found in the gastric       antrum. There were no stigmata of recent bleeding.      The examined duodenum was normal. Impression:           - Normal esophagus.                       - Non-bleeding erosive gastropathy.                       - Normal examined duodenum.                       - No specimens collected. Recommendation:       - Discharge patient to home.                       - Resume previous diet.                       - Perform a colonoscopy today. Procedure Code(s):    --- Professional ---                       [phone], Esophagogastroduodenoscopy, flexible, transoral;                        diagnostic, including collection of specimen(s) by                        brushing or washing, when performed (separate procedure) Diagnosis Code(s):    --- Professional ---                       D50.9, Iron deficiency anemia, unspecified                       K31.89, Other diseases of stomach and duodenum CPT copyright 2016 American Medical Association. All rights reserved. The codes documented in this report are preliminary and upon coder review may  be revised to meet current compliance requirements. Lucilla Lame MD, MD 06/25/2017 10:11:26 AM This report has been signed electronically. Number of Addenda: 0 Note Initiated On: 06/25/2017 10:00 AM      [facility]

## 2017-06-26 ENCOUNTER — Encounter: Payer: Self-pay | Admitting: Gastroenterology

## 2017-06-30 ENCOUNTER — Encounter: Payer: Self-pay | Admitting: Gastroenterology

## 2017-07-01 ENCOUNTER — Inpatient Hospital Stay: Payer: Medicaid Other

## 2017-07-01 VITALS — BP 125/82 | HR 69 | Temp 96.9°F | Resp 18

## 2017-07-01 DIAGNOSIS — D500 Iron deficiency anemia secondary to blood loss (chronic): Secondary | ICD-10-CM

## 2017-07-01 DIAGNOSIS — D509 Iron deficiency anemia, unspecified: Secondary | ICD-10-CM | POA: Diagnosis not present

## 2017-07-01 MED ORDER — IRON SUCROSE 20 MG/ML IV SOLN
200.0000 mg | Freq: Once | INTRAVENOUS | Status: AC
  Administered 2017-07-01: 200 mg via INTRAVENOUS
  Filled 2017-07-01: qty 10

## 2017-07-01 MED ORDER — IRON SUCROSE 20 MG/ML IV SOLN
INTRAVENOUS | Status: AC
  Filled 2017-07-01: qty 10

## 2017-07-01 MED ORDER — SODIUM CHLORIDE 0.9 % IV SOLN
Freq: Once | INTRAVENOUS | Status: AC
Start: 2017-07-01 — End: 2017-07-01
  Administered 2017-07-01: 14:00:00 via INTRAVENOUS
  Filled 2017-07-01: qty 1000

## 2017-07-01 NOTE — Patient Instructions (Signed)

## 2017-07-14 ENCOUNTER — Encounter: Payer: Self-pay | Admitting: Hematology and Oncology

## 2017-07-29 ENCOUNTER — Inpatient Hospital Stay: Payer: Medicaid Other | Attending: Hematology and Oncology

## 2017-07-29 DIAGNOSIS — D500 Iron deficiency anemia secondary to blood loss (chronic): Secondary | ICD-10-CM | POA: Diagnosis present

## 2017-07-29 DIAGNOSIS — D472 Monoclonal gammopathy: Secondary | ICD-10-CM

## 2017-07-29 LAB — CBC WITH DIFFERENTIAL/PLATELET
Basophils Absolute: 0 10*3/uL (ref 0–0.1)
Basophils Relative: 1 %
Eosinophils Absolute: 0.1 10*3/uL (ref 0–0.7)
Eosinophils Relative: 2 %
HCT: 38.3 % (ref 35.0–47.0)
Hemoglobin: 12.6 g/dL (ref 12.0–16.0)
Lymphocytes Relative: 45 %
Lymphs Abs: 1.6 10*3/uL (ref 1.0–3.6)
MCH: 28.9 pg (ref 26.0–34.0)
MCHC: 32.8 g/dL (ref 32.0–36.0)
MCV: 88 fL (ref 80.0–100.0)
Monocytes Absolute: 0.2 10*3/uL (ref 0.2–0.9)
Monocytes Relative: 7 %
Neutro Abs: 1.6 10*3/uL (ref 1.4–6.5)
Neutrophils Relative %: 45 %
Platelets: 178 10*3/uL (ref 150–440)
RBC: 4.35 MIL/uL (ref 3.80–5.20)
RDW: 19.8 % — ABNORMAL HIGH (ref 11.5–14.5)
WBC: 3.5 10*3/uL — ABNORMAL LOW (ref 3.6–11.0)

## 2017-07-29 LAB — FERRITIN: Ferritin: 57 ng/mL (ref 11–307)

## 2017-12-15 ENCOUNTER — Inpatient Hospital Stay: Payer: Medicaid Other | Attending: Hematology and Oncology

## 2017-12-15 NOTE — Progress Notes (Deleted)
[facility] day:  12/15/2017   Chief Complaint: Katherine Brennan is a 52 y.o. female with iron deficiency anemia who is seen for 6 month assessment.  HPI:  The patient was last seen in the medical oncology clinic on 06/17/2017.  At that time, patient was feeling "pretty darn well" following her iron infusions.  Patient noted more energy.  She complained of some exertional dyspnea, however denies chest pain.  Patient continued to have heavy menses.  Last cycle lasted 8 days.  Ice pica and restless legs have improved.  Patient noted a "pins and needles" sensation in her right hand.  Patient scheduled to see gastroenterology.  Exam was stable.  Hemoglobin 10.8, hematocrit 33.7, MCV 86.7, MCH 27.7, and platelets 230,000.  Ferritin was low at 25.  Patient was scheduled for weekly Venofer x3.  Patient received Venofer 200 mg IV on 06/17/2017 and 07/01/2017.  Repeat labs were done on 07/29/2017 revealing a hemoglobin of 12.6, hematocrit 38.3, MCV 88.0, MCH 28.9, and platelets 178,000.  Ferritin had improved to 57.  Patient seen in consult on 06/18/2017 by Dr. Lucilla Lame (gastroenterology).  Based on her history of persistent IDA, and heme positive stools, patient was scheduled for an EGD and colonoscopy to assess for gastrointestinal bleeding.  Patient  underwent EGD and colonoscopy on 06/25/2017.  EGD revealed a normal esophagus, and a nonbleeding erosive gastropathy.  No biopsies were taken.  Colonoscopy demonstrated a 10 mm sessile polyp in the transverse colon.  Pathology results consistent with tubular adenoma.  In the interim,  Past Medical History:  Diagnosis Date  . Anemia   . Arthritis   . Depression   . Hypothyroidism   . Memory changes   . Migraines   . Silent myocardial ischemia   . Vision changes     Past Surgical History:  Procedure Laterality Date  . [place], 2007  . COLONOSCOPY WITH PROPOFOL N/A 06/25/2017   Procedure:  COLONOSCOPY WITH PROPOFOL;  Surgeon: Lucilla Lame, MD;  Location: [place];  Service: Gastroenterology;  Laterality: N/A;  . ESOPHAGOGASTRODUODENOSCOPY (EGD) WITH PROPOFOL N/A 06/25/2017   Procedure: ESOPHAGOGASTRODUODENOSCOPY (EGD) WITH PROPOFOL;  Surgeon: Lucilla Lame, MD;  Location: [place];  Service: Gastroenterology;  Laterality: N/A;  . POLYPECTOMY N/A 06/25/2017   Procedure: POLYPECTOMY;  Surgeon: Lucilla Lame, MD;  Location: [place];  Service: Gastroenterology;  Laterality: N/A;    Family History  Problem Relation Age of Onset  . CAD Father   . Breast cancer Neg Hx     Social History:  reports that she quit smoking about 11 years ago. Her smoking use included cigarettes. she has never used smokeless tobacco. She reports that she drinks alcohol. She reports that she does not use drugs.  She drinks alcohol socially.  She is divorced.  She was late to her clinic appointment today as she had to bring her child to the cardiologist.  She lives in [place].  The patient is alone today.  Allergies: No Known Allergies  Current Medications: Current Outpatient Medications  Medication Sig Dispense Refill  . albuterol (PROVENTIL HFA;VENTOLIN HFA) 108 (90 Base) MCG/ACT inhaler Inhale 2 puffs into the lungs every 6 (six) hours as needed.    . cetirizine (ZYRTEC) 10 MG tablet Take 10 mg by mouth daily.    . enalapril (VASOTEC) 2.5 MG tablet Take 2.5 mg by mouth daily.    . ferrous sulfate 325 (65 FE) MG  tablet Take 1 tablet (325 mg total) by mouth 2 (two) times daily with a meal. (Patient not taking: Reported on 06/18/2017) 60 tablet 0  . levothyroxine (SYNTHROID) 50 MCG tablet Take 1 tablet (50 mcg total) by mouth daily before breakfast. 30 tablet 0  . levothyroxine (SYNTHROID, LEVOTHROID) 125 MCG tablet Take 125 mcg by mouth daily.    . meclizine (ANTIVERT) 12.5 MG tablet Take 12.5 mg by mouth 3 (three) times daily.    Marland Kitchen topiramate (TOPAMAX) 25 MG tablet Take 25 mg  by mouth 2 (two) times daily.     No current facility-administered medications for this visit.     Review of Systems:  GENERAL:  Feels "good".  No fevers or sweats.  Weight down 2 pounds. PERFORMANCE STATUS (ECOG):  1 HEENT:  Mole behind left eye (eye weak; sees dots).  No runny nose, sore throat, mouth sores or tenderness. Lungs:  Shortness of breath on exertion.  No cough.  No hemoptysis. Cardiac:  No chest pain, palpitations, orthopnea, or PND. GI:  Stool not solid x 1 year.  No nausea, vomiting, diarrhea, constipation, melena or hematochezia. GU:  Trouble holding urine s/p 3 C sections.  No urgency, frequency, dysuria, or hematuria. Musculoskeletal:  Legs hurt (left knee and hips hurt if sits too long).  No back pain.  No joint pain.  No muscle tenderness. Extremities:  No pain or swelling. Skin:  Unexplained bruises (chronic).  No rashes or skin changes. Neuro:  Migraine headaches.  No numbness or weakness, balance or coordination issues. Endocrine:  No diabetes.  Hypothyroid on Synthroid.  No hot flashes or night sweats. Psych:  No mood changes, depression or anxiety. Pain:  No focal pain. Review of systems:  All other systems reviewed and found to be negative.  Physical Exam: There were no vitals taken for this visit. GENERAL:  Well developed, well nourished, woman sitting comfortably in the exam room in no acute distress. MENTAL STATUS:  Alert and oriented to person, place and time. HEAD:  Dark brown hair.  Normocephalic, atraumatic, face symmetric, no Cushingoid features. EYES:  Pupils equal round and reactive to light and accomodation.  No conjunctivitis or scleral icterus. ENT: Right upper lip piercing.  Oropharynx clear without lesion.  Tongue normal. Mucous membranes moist.  RESPIRATORY:  Clear to auscultation without rales, wheezes or rhonchi. CARDIOVASCULAR:  Regular rate and rhythm without murmur, rub or gallop. ABDOMEN:  Soft, non-tender, with active bowel sounds, and  no hepatosplenomegaly.  No masses. SKIN:  Tattoo.  No petechiae.  No rashes, ulcers or lesions. EXTREMITIES: No edema, no skin discoloration or tenderness.  No palpable cords. LYMPH NODES: No palpable cervical, supraclavicular, axillary or inguinal adenopathy  NEUROLOGICAL: Unremarkable. PSYCH:  Appropriate.   No visits with results within 3 Day(s) from this visit.  Latest known visit with results is:  Appointment on 07/29/2017  Component Date Value Ref Range Status  . WBC 07/29/2017 3.5* 3.6 - 11.0 K/uL Final  . RBC 07/29/2017 4.35  3.80 - 5.20 MIL/uL Final  . Hemoglobin 07/29/2017 12.6  12.0 - 16.0 g/dL Final  . HCT 07/29/2017 38.3  35.0 - 47.0 % Final  . MCV 07/29/2017 88.0  80.0 - 100.0 fL Final  . MCH 07/29/2017 28.9  26.0 - 34.0 pg Final  . MCHC 07/29/2017 32.8  32.0 - 36.0 g/dL Final  . RDW 07/29/2017 19.8* 11.5 - 14.5 % Final  . Platelets 07/29/2017 178  150 - 440 K/uL Final  . Neutrophils Relative %  07/29/2017 45  % Final  . Neutro Abs 07/29/2017 1.6  1.4 - 6.5 K/uL Final  . Lymphocytes Relative 07/29/2017 45  % Final  . Lymphs Abs 07/29/2017 1.6  1.0 - 3.6 K/uL Final  . Monocytes Relative 07/29/2017 7  % Final  . Monocytes Absolute 07/29/2017 0.2  0.2 - 0.9 K/uL Final  . Eosinophils Relative 07/29/2017 2  % Final  . Eosinophils Absolute 07/29/2017 0.1  0 - 0.7 K/uL Final  . Basophils Relative 07/29/2017 1  % Final  . Basophils Absolute 07/29/2017 0.0  0 - 0.1 K/uL Final  . Ferritin 07/29/2017 57  11 - 307 ng/mL Final    Assessment:  Katherine Brennan is a 52 y.o. female with iron deficiency anemia secondary to menorrhagia.  She denies any melena, hematochezia or hematuria.  Diet is good.  She has been on oral iron BID x 1 year.  She has a history of ice pica.  She received 2 units of PRBCs on 08/29/2015 for menorrhagia.  She has received [facility] 2 [facility].  Work-up on 04/15/2017 revealed a hematocrit of 28.6, hemoglobin 9.0, MCV 79.7, platelets  245,000, white count 4400 with an ANC of 2300.  Ferritin was 4. One of 3 cards were guaiac positive in 04/2017.  EGD on 06/25/2017 revealed a normal esophagus, and a nonbleeding erosive gastropathy.  No biopsies were taken.  Colonoscopy on 06/25/2017 demonstrated a 10 mm sessile polyp in the transverse colon.  Pathology results consistent with tubular adenoma.  Ferritin has been followed: 5 on 01/02/2009, 20 on 04/04/2009, 8 on 10/25/2009, 4 on 04/15/2017, 18 on 05/13/2017, 25 on 06/16/2017, and 57 on 07/29/2017. Ferritin goal is 100.  She received Venofer weekly x 4 (04/29/2017 - 05/27/2017). Venofer weekly x 2 (06/17/2017 - 07/01/2017).  She has a history of a monoclonal gammopathy of unknown significance.  SPEP on 04/15/2017 revealed a 0.5 g/dL IgG monoclonal protein with kappa light chain specificity.  IgG was 1713 ([phone]).  Free light chains ratio was 1.71 (0.26-1.65).  24 hour UPEP on 05/01/2017 was negative.  SPEP on 09/27/2015 revealed an M spike of 0.5 g/dL.   M-spike has been followed: 0.5 gm/dL on 09/27/2015 and 0.5 gm/dL on 04/15/2017.  She bruises easily.  She denies any excess bleeding with surgeries.  She does not take aspirin or ibuprofen.  She denies any family history of bleeding.  Work-up on 04/15/2017 revealed the following normal studies:  PT/INR, von Willebrand panel, platelet function assay.  Symptomatically,  she feels good.  Exam is unremarkable.  Hemoglobin has improved from 9.0 to 10.8.  Ferritin has increased from 4 to 25 after 4 doses of IV Venofer.   Plan: 1.  Labs today: CBC with differential, ferritin 2.  Discuss iron stores.  Ferritin XXX 3.  Discussed GI consult. 4.  Discussed GYN consult.  2.  Discuss UPEP results. Likely MGUS.  Discuss plan to monitor monoclonal protein every 6 months.  3.  Discuss GI evaluation for iron deficiency anemia; 1 of 3 guaiac + stools; change in stool quality. Sees Dr. Allen Norris on 06/18/2017. 4.  Patient still having heavy  menses. She needs to be seen by gynecology. Called Dr. Iona Beard to let her know; PCP to refer.  4.  Ferritin remains low despite IV replacement (4 doses); ferritin 25.  We have set a goal of 100 for this patient. Venofer today, then weekly x 2 5.  RTC in 6 weeks for labs (CBC with diff,  ferritin) 6.  RTC in 3 months for MD assessment, labs (CBC with diff, ferritin, SPEP- the day before), and +/- Venofer   Honor Loh, NP  12/15/2017, 6:21 PM

## 2017-12-16 ENCOUNTER — Inpatient Hospital Stay: Payer: Medicaid Other | Admitting: Urgent Care

## 2017-12-16 ENCOUNTER — Other Ambulatory Visit: Payer: Self-pay | Admitting: Hematology and Oncology

## 2017-12-16 ENCOUNTER — Inpatient Hospital Stay: Payer: Medicaid Other

## 2018-04-05 ENCOUNTER — Ambulatory Visit
Admission: EM | Admit: 2018-04-05 | Discharge: 2018-04-05 | Disposition: A | Discharge status: Home / Self Care | Payer: Medicaid Other | Attending: Family Medicine | Admitting: Family Medicine

## 2018-04-05 ENCOUNTER — Other Ambulatory Visit: Payer: Self-pay

## 2018-04-05 DIAGNOSIS — M7631 Iliotibial band syndrome, right leg: Secondary | ICD-10-CM

## 2018-04-05 MED ORDER — PREDNISONE 20 MG PO TABS: ORAL_TABLET | ORAL | 0 refills | Status: DC

## 2018-04-05 NOTE — Discharge Instructions (Signed)
Extra strength Tylenol 2 tabs three times per day Heat in the morning, ice in the evening stretches

## 2018-04-05 NOTE — ED Provider Notes (Signed)
[facility]    CSN: [id number] Arrival date & time: 04/05/18  1543     History   Chief Complaint Chief Complaint  Patient presents with  . Hip Pain    right    HPI CARESS REFFITT is a 52 y.o. female.   The history is provided by the patient.  Hip Pain  This is a new problem. The current episode started more than 1 week ago (4 months ago; denies any traumatic injury, fevers, chills). The problem occurs daily. The problem has been gradually worsening. Associated symptoms include shortness of breath. Pertinent negatives include no chest pain, no abdominal pain and no headaches. The symptoms are relieved by NSAIDs.    Past Medical History:  Diagnosis Date  . Anemia   . Arthritis   . Depression   . Hypothyroidism   . Memory changes   . Migraines   . Silent myocardial ischemia   . Vision changes     Patient Active Problem List   Diagnosis Date Noted  . Benign neoplasm of transverse colon   . Other diseases of stomach and duodenum   . Monoclonal gammopathy 04/22/2017  . Monoclonal gammopathy of unknown significance (MGUS) 04/15/2017  . Menorrhagia with regular cycle 04/15/2017  . Easy bruising 04/15/2017  . Chronic pain of left knee 10/14/2016  . Bilateral hand numbness 10/14/2016  . Mold exposure 05/15/2016  . Hypothyroidism (acquired) 05/15/2016  . Goiter 05/15/2016  . Positive ANA (antinuclear antibody) 09/27/2015  . DOE (dyspnea on exertion) 09/27/2015  . CRP elevated 09/27/2015  . Arthralgia of multiple joints 09/27/2015  . Iron deficiency anemia 08/29/2015    Past Surgical History:  Procedure Laterality Date  . [place], 2007  . COLONOSCOPY WITH PROPOFOL N/A 06/25/2017   Procedure: COLONOSCOPY WITH PROPOFOL;  Surgeon: Lucilla Lame, MD;  Location: [facility];  Service: Gastroenterology;  Laterality: N/A;  . ESOPHAGOGASTRODUODENOSCOPY (EGD) WITH PROPOFOL N/A 06/25/2017   Procedure: ESOPHAGOGASTRODUODENOSCOPY (EGD) WITH  PROPOFOL;  Surgeon: Lucilla Lame, MD;  Location: [facility];  Service: Gastroenterology;  Laterality: N/A;  . POLYPECTOMY N/A 06/25/2017   Procedure: POLYPECTOMY;  Surgeon: Lucilla Lame, MD;  Location: [facility];  Service: Gastroenterology;  Laterality: N/A;    OB History   None      Home Medications    Prior to Admission medications   Medication Sig Start Date End Date Taking? Authorizing Provider  enalapril (VASOTEC) 2.5 MG tablet Take 2.5 mg by mouth daily. 07/16/16  Yes [provider]  levothyroxine (SYNTHROID) 50 MCG tablet Take 1 tablet (50 mcg total) by mouth daily before breakfast. 08/29/15  Yes Vaughan Basta, MD  meloxicam (MOBIC) 7.5 MG tablet Take by mouth. 09/01/17 09/01/18 Yes [provider]  albuterol (PROVENTIL HFA;VENTOLIN HFA) 108 (90 Base) MCG/ACT inhaler Inhale 2 puffs into the lungs every 6 (six) hours as needed. 05/14/16 05/14/17  [provider]  cetirizine (ZYRTEC) 10 MG tablet Take 10 mg by mouth daily. 05/14/16 06/22/17  [provider]  ferrous sulfate 325 (65 FE) MG tablet Take 1 tablet (325 mg total) by mouth 2 (two) times daily with a meal. Patient not taking: Reported on 06/18/2017 08/29/15   Vaughan Basta, MD  levothyroxine (SYNTHROID, LEVOTHROID) 125 MCG tablet Take 125 mcg by mouth daily. 03/26/17 03/26/18  [provider]  meclizine (ANTIVERT) 12.5 MG tablet Take 12.5 mg by mouth 3 (three) times daily.    [provider]  predniSONE (DELTASONE) 20 MG tablet  3 tabs po once day 1, then 2 tabs po qd x 2 days, then 1 tab po qd x 2 days, then half a tab po qd x 2 days 04/05/18   Norval Gable, MD  topiramate (TOPAMAX) 25 MG tablet Take 25 mg by mouth 2 (two) times daily. 03/25/17 03/25/18  [provider]    Family History Family History  Problem Relation Age of Onset  . CAD Father   . Breast cancer Neg Hx     Social History Social History   Tobacco Use  .  Smoking status: Former Smoker    Types: Cigarettes    Last attempt to quit: 2008    Years since quitting: 11.5  . Smokeless tobacco: Never Used  Substance Use Topics  . Alcohol use: Yes    Comment: socially  . Drug use: No     Allergies   Patient has no known allergies.   Review of Systems Review of Systems  Respiratory: Positive for shortness of breath.   Cardiovascular: Negative for chest pain.  Gastrointestinal: Negative for abdominal pain.  Neurological: Negative for headaches.     Physical Exam Triage Vital Signs ED Triage Vitals  Enc Vitals Group     BP 04/05/18 1607 112/90     Pulse Rate 04/05/18 1607 72     Resp 04/05/18 1607 18     Temp 04/05/18 1607 98.3 F (36.8 C)     Temp Source 04/05/18 1607 Oral     SpO2 04/05/18 1607 97 %     Weight 04/05/18 1603 211 lb (95.7 kg)     Height 04/05/18 1603 5\' 4"  (1.626 m)     Head Circumference --      Peak Flow --      Pain Score 04/05/18 1603 8     Pain Loc --      Pain Edu? --      Excl. in [place]? --    No data found.  Updated Vital Signs BP 112/90 (BP Location: Left Arm)   Pulse 72   Temp 98.3 F (36.8 C) (Oral)   Resp 18   Ht 5\' 4"  (1.626 m)   Wt 211 lb (95.7 kg)   LMP 08/06/2017   SpO2 97%   BMI 36.22 kg/m   Visual Acuity Right Eye Distance:   Left Eye Distance:   Bilateral Distance:    Right Eye Near:   Left Eye Near:    Bilateral Near:     Physical Exam  Constitutional: She appears well-developed and well-nourished. No distress.  Musculoskeletal:       Right hip: She exhibits tenderness (over the IT band). She exhibits normal range of motion, normal strength, no bony tenderness, no swelling, no crepitus, no deformity and no laceration.  Skin: She is not diaphoretic.  Nursing note and vitals reviewed.    UC Treatments / Results  Labs (all labs ordered are listed, but only abnormal results are displayed) Labs Reviewed - No data to display  EKG None  Radiology No results  found.  Procedures Procedures (including critical care time)  Medications Ordered in UC Medications - No data to display  Initial Impression / Assessment and Plan / UC Course  I have reviewed the triage vital signs and the nursing notes.  Pertinent labs & imaging results that were available during my care of the patient were reviewed by me and considered in my medical decision making (see chart for details).      Final Clinical  Impressions(s) / UC Diagnoses   Final diagnoses:  It band syndrome, right     Discharge Instructions     Extra strength Tylenol 2 tabs three times per day Heat in the morning, ice in the evening stretches    ED Prescriptions    Medication Sig Dispense Auth. Provider   predniSONE (DELTASONE) 20 MG tablet 3 tabs po once day 1, then 2 tabs po qd x 2 days, then 1 tab po qd x 2 days, then half a tab po qd x 2 days 10 tablet Norval Gable, MD     1.diagnosis reviewed with patient 2. rx as per orders above; reviewed possible side effects, interactions, risks and benefits  3. Recommend supportive treatment as above  4. Follow-up prn if symptoms worsen or don't improve   Controlled Substance Prescriptions [place] Controlled Substance Registry consulted? Not Applicable   Norval Gable, MD 04/05/18 [phone]

## 2018-04-05 NOTE — ED Triage Notes (Signed)
Patient complains of right hip pain x 4 months. Patient states that this has been gradually worsening. Patient states that pain is worse after being still for a while and moving again.

## 2018-08-12 DIAGNOSIS — M179 Osteoarthritis of knee, unspecified: Secondary | ICD-10-CM | POA: Insufficient documentation

## 2018-08-12 DIAGNOSIS — M222X9 Patellofemoral disorders, unspecified knee: Secondary | ICD-10-CM | POA: Insufficient documentation

## 2018-08-12 DIAGNOSIS — M545 Low back pain, unspecified: Secondary | ICD-10-CM | POA: Insufficient documentation

## 2019-03-23 ENCOUNTER — Other Ambulatory Visit: Payer: Self-pay | Admitting: Family Medicine

## 2019-03-23 DIAGNOSIS — Z0000 Encounter for general adult medical examination without abnormal findings: Secondary | ICD-10-CM

## 2019-03-23 DIAGNOSIS — E049 Nontoxic goiter, unspecified: Secondary | ICD-10-CM

## 2019-04-04 ENCOUNTER — Ambulatory Visit: Payer: Medicaid Other

## 2019-04-19 ENCOUNTER — Ambulatory Visit
Admission: RE | Admit: 2019-04-19 | Discharge: 2019-04-19 | Disposition: A | Discharge status: Home / Self Care | Payer: Medicaid Other | Source: Ambulatory Visit | Attending: Family Medicine | Admitting: Family Medicine

## 2019-04-19 ENCOUNTER — Encounter (INDEPENDENT_AMBULATORY_CARE_PROVIDER_SITE_OTHER): Payer: Self-pay

## 2019-04-19 ENCOUNTER — Other Ambulatory Visit: Payer: Self-pay

## 2019-04-19 DIAGNOSIS — E049 Nontoxic goiter, unspecified: Secondary | ICD-10-CM | POA: Diagnosis not present

## 2019-04-19 DIAGNOSIS — Z0000 Encounter for general adult medical examination without abnormal findings: Secondary | ICD-10-CM | POA: Insufficient documentation

## 2019-08-16 ENCOUNTER — Ambulatory Visit (INDEPENDENT_AMBULATORY_CARE_PROVIDER_SITE_OTHER): Payer: Medicaid Other

## 2019-08-16 ENCOUNTER — Other Ambulatory Visit: Payer: Self-pay | Admitting: Podiatry

## 2019-08-16 ENCOUNTER — Encounter: Payer: Self-pay | Admitting: Podiatry

## 2019-08-16 ENCOUNTER — Other Ambulatory Visit: Payer: Self-pay

## 2019-08-16 ENCOUNTER — Ambulatory Visit: Payer: Medicaid Other | Admitting: Podiatry

## 2019-08-16 DIAGNOSIS — M779 Enthesopathy, unspecified: Secondary | ICD-10-CM

## 2019-08-16 DIAGNOSIS — M722 Plantar fascial fibromatosis: Secondary | ICD-10-CM

## 2019-08-16 DIAGNOSIS — G629 Polyneuropathy, unspecified: Secondary | ICD-10-CM

## 2019-08-16 MED ORDER — METHYLPREDNISOLONE 4 MG PO TBPK: ORAL_TABLET | ORAL | 0 refills | Status: DC

## 2019-08-16 MED ORDER — MELOXICAM 15 MG PO TABS: 15.0000 mg | ORAL_TABLET | Freq: Every day | ORAL | 1 refills | Status: DC

## 2019-08-17 LAB — HEMOGLOBIN A1C
Est. average glucose Bld gHb Est-mCnc: 114 mg/dL
Hgb A1c MFr Bld: 5.6 % (ref 4.8–5.6)

## 2019-08-19 NOTE — Progress Notes (Signed)
   Subjective: 53 y.o. female presenting today as a new patient with a chief complaint of sharp, stabbing pain to the bilateral heels that began 6-7 months ago. She reports associated numbness of the lateral feet and 5th toes. She states the pain is worse in the morning and when she stands from a seated position. She has been using ice therapy and soaking in Epsom salt for treatment. Patient is here for further evaluation and treatment.   Past Medical History:  Diagnosis Date  . Anemia   . Arthritis   . Depression   . Hypothyroidism   . Memory changes   . Migraines   . Silent myocardial ischemia   . Vision changes      Objective: Physical Exam General: The patient is alert and oriented x3 in no acute distress.  Dermatology: Skin is warm, dry and supple bilateral lower extremities. Negative for open lesions or macerations bilateral.   Vascular: Dorsalis Pedis and Posterior Tibial pulses palpable bilateral.  Capillary fill time is immediate to all digits.  Neurological: Epicritic and protective threshold diminished bilateral.   Musculoskeletal: Tenderness to palpation to the plantar aspect of the bilateral heels along the plantar fascia. All other joints range of motion within normal limits bilateral. Strength 5/5 in all groups bilateral.   Radiographic exam: Normal osseous mineralization. Joint spaces preserved. No fracture/dislocation/boney destruction. No other soft tissue abnormalities or radiopaque foreign bodies.   Assessment: 1. plantar fasciitis bilateral feet 2. Peripheral neuropathy BLE  Plan of Care:  1. Patient evaluated. Xrays reviewed.   2. Injection of 0.5cc Celestone soluspan injected into the bilateral heels.  3. Rx for Medrol Dose Pak placed 4. Rx for Meloxicam ordered for patient. 5. Order placed for a1C levels due to family history of diabetes mellitus.  6. Compression anklets dispensed bilaterally.   7. Return to clinic in 4 weeks.    Edrick Kins,  DPM [facility]  Dr. Edrick Kins, DPM    [address], [place] [address]                Office [phone]  Fax [phone]

## 2019-09-13 ENCOUNTER — Ambulatory Visit: Payer: Medicaid Other | Admitting: Podiatry

## 2020-04-04 ENCOUNTER — Other Ambulatory Visit: Payer: Self-pay | Admitting: Family Medicine

## 2020-04-04 DIAGNOSIS — Z1231 Encounter for screening mammogram for malignant neoplasm of breast: Secondary | ICD-10-CM

## 2020-04-04 DIAGNOSIS — N644 Mastodynia: Secondary | ICD-10-CM

## 2020-06-04 ENCOUNTER — Other Ambulatory Visit: Payer: Self-pay | Admitting: Podiatry

## 2020-10-16 DIAGNOSIS — G8929 Other chronic pain: Secondary | ICD-10-CM | POA: Insufficient documentation

## 2020-10-16 DIAGNOSIS — M5416 Radiculopathy, lumbar region: Secondary | ICD-10-CM | POA: Insufficient documentation

## 2020-10-16 DIAGNOSIS — I10 Essential (primary) hypertension: Secondary | ICD-10-CM | POA: Insufficient documentation

## 2020-11-08 ENCOUNTER — Other Ambulatory Visit: Payer: Self-pay | Admitting: Physical Medicine & Rehabilitation

## 2020-11-08 DIAGNOSIS — G8929 Other chronic pain: Secondary | ICD-10-CM

## 2020-11-14 ENCOUNTER — Telehealth: Payer: Self-pay

## 2020-11-14 NOTE — Telephone Encounter (Signed)
Sent Colonoscopy and EGD report with path per request for [facility] clinic GI

## 2020-11-20 ENCOUNTER — Ambulatory Visit: Payer: Medicaid Other

## 2020-11-21 ENCOUNTER — Ambulatory Visit: Payer: Medicaid Other

## 2021-01-02 IMAGING — US US THYROID
1 series · 12 of 25 positions shown · non-contrast
Comparison: Scintigraphy 04/01/2011

CLINICAL DATA: Goiter.

EXAM:
THYROID ULTRASOUND
TECHNIQUE: Ultrasound examination of the thyroid gland and adjacent soft
tissues was performed.

[Series 1: us thyroid · 0.06mm/px · 12 of 73 slices shown]
[im 4/73]
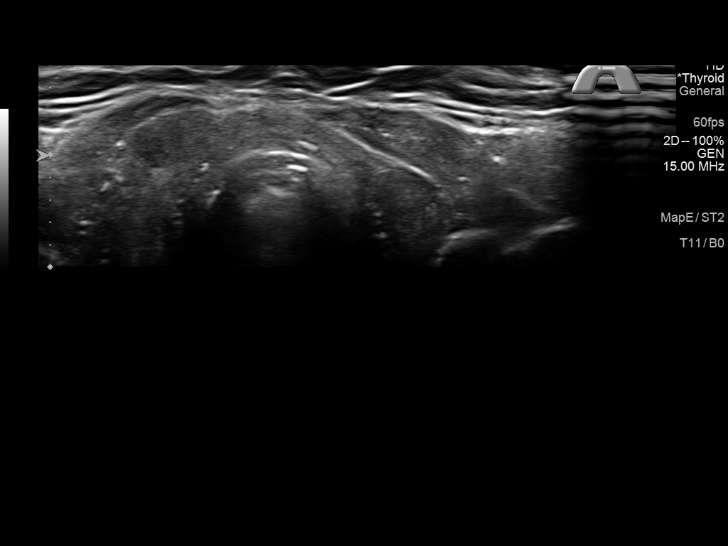
[im 10/73]
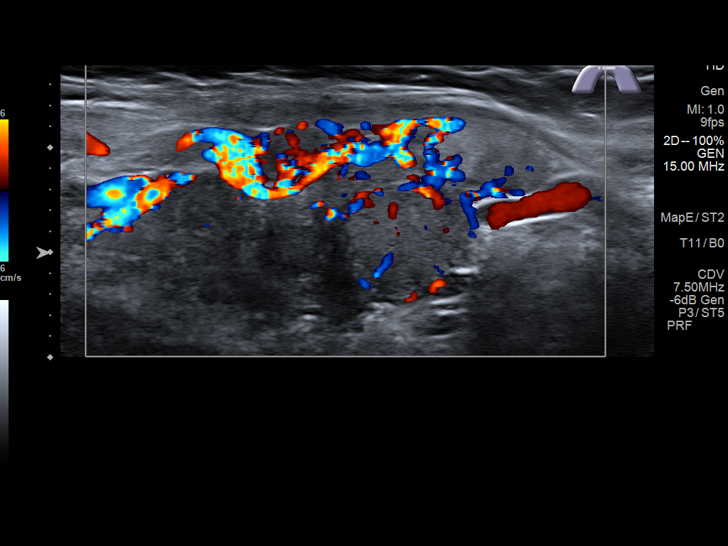
[im 16/73]
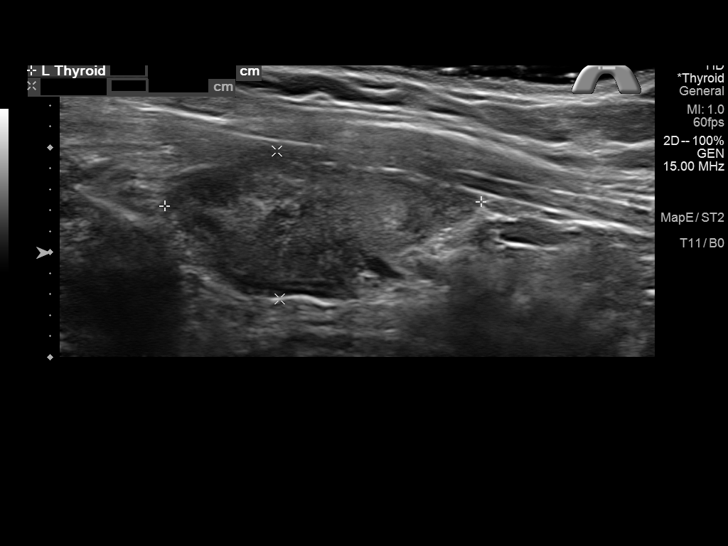
[im 22/73]
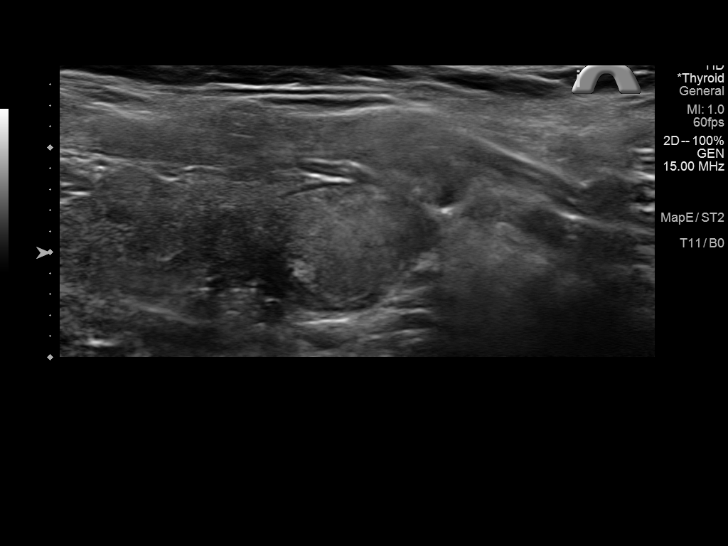
[im 28/73]
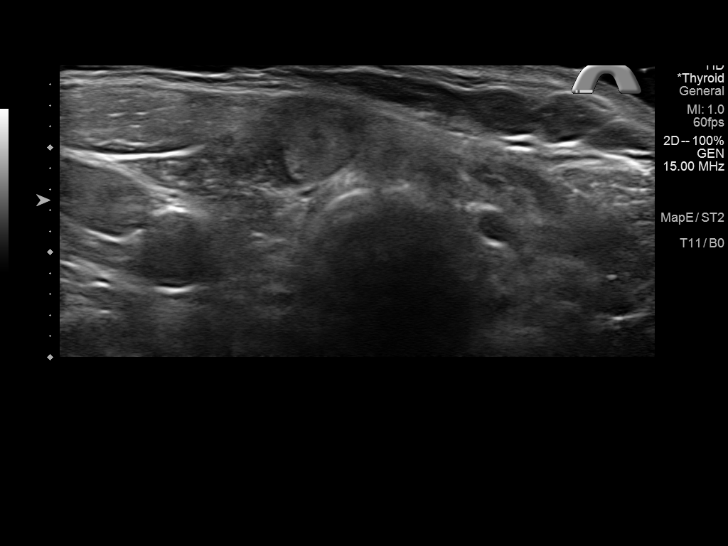
[im 34/73]
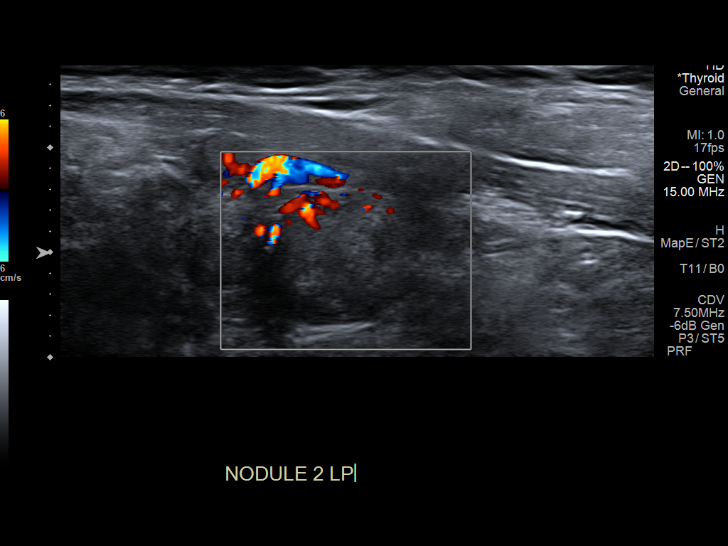
[im 40/73]
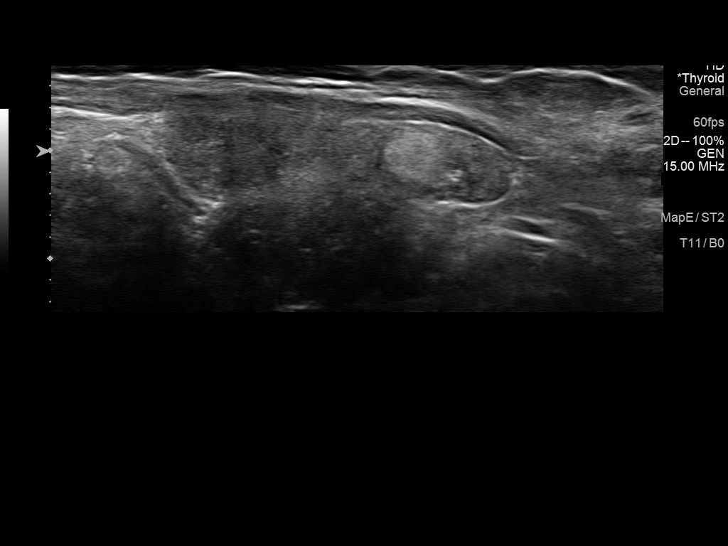
[im 46/73]
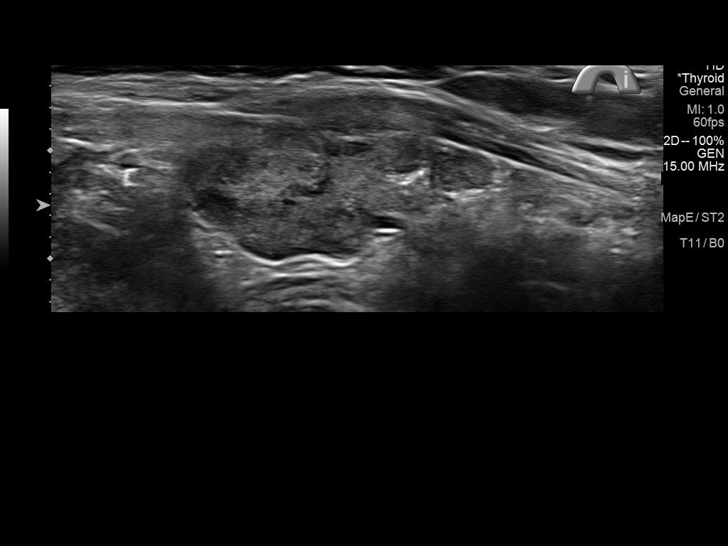
[im 52/73]
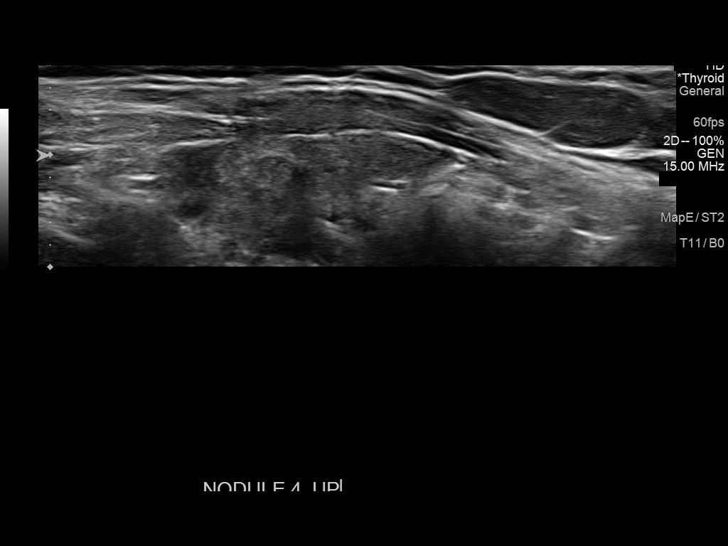
[im 58/73]
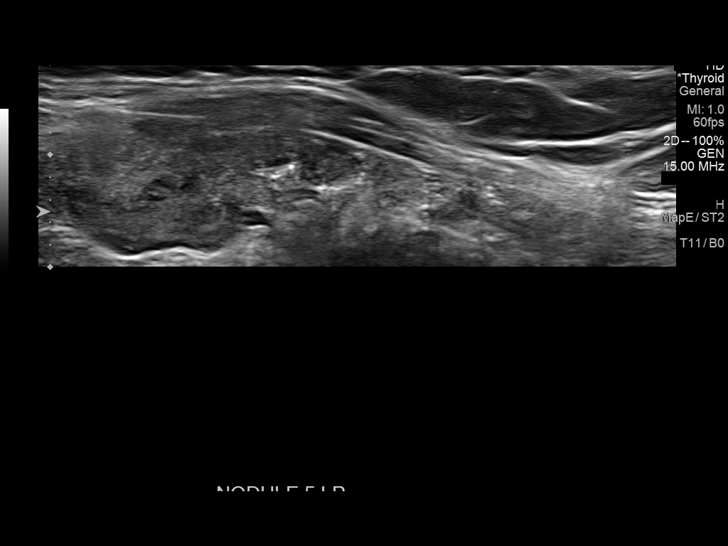
[im 64/73]
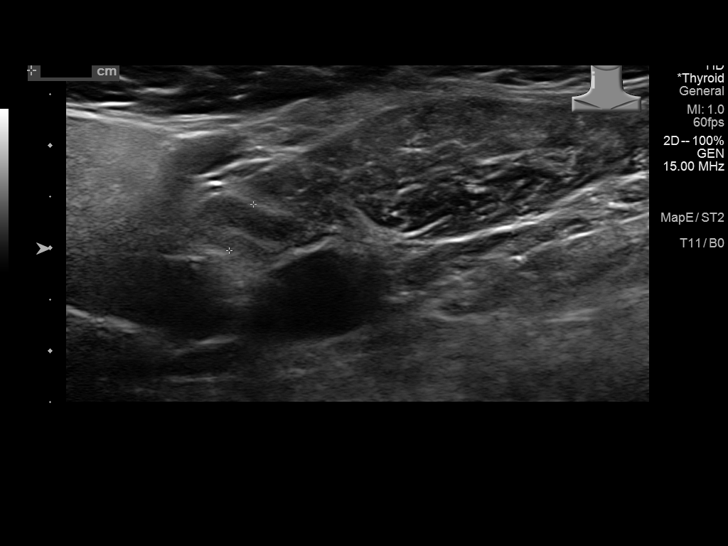
[im 70/73]
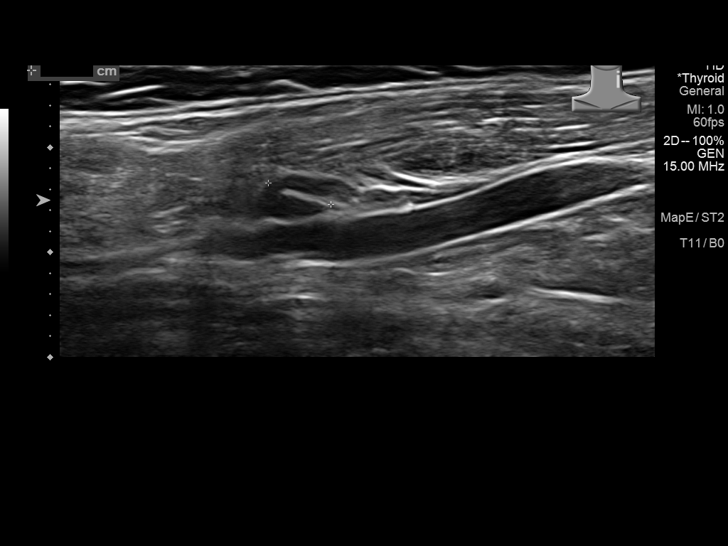

[12 of 25 positions shown; findings below may reference images not displayed]

FINDINGS: Parenchymal Echotexture: Moderately heterogenous, hyperemic

Isthmus: 0.3 cm thickness

Right lobe: 4.2 x 2 x 1.7 cm

Left lobe: 3 x 1.4 x 0.8 cm

_________________________________________________________

Estimated total number of nodules >/= 1 cm: 2

Number of spongiform nodules >/=  2 cm not described below (TR1): 0

Number of mixed cystic and solid nodules >/= 1.5 cm not described
below (TR2): 0

_________________________________________________________

0.6 cm hypoechoic superior right nodule without calcifications; This
nodule does NOT meet TI-RADS criteria for biopsy or dedicated
follow-up.

Nodule # 2:

Location: Right; Inferior posterior

Maximum size: 1.4 cm; Other 2 dimensions: 1.1 x 0.7 cm

Composition: solid/almost completely solid (2)

Echogenicity: hypoechoic (2)

Shape: taller-than-wide (3)

Margins: smooth (0)

Echogenic foci: none (0)

ACR TI-RADS total points: 7.

ACR TI-RADS risk category: TR5 (>/= 7 points).

ACR TI-RADS recommendations:

**Given size (>/= 1.0 cm) and appearance, fine needle aspiration of
this highly suspicious nodule should be considered based on TI-RADS
criteria.

_________________________________________________________

Nodule # 3:

Location: Right; Inferior medial

Maximum size: 1.2 cm; Other 2 dimensions: 1.1 x 0.8 cm

Composition: solid/almost completely solid (2)

Echogenicity: isoechoic (1)

Shape: not taller-than-wide (0)

Margins: ill-defined (0)

Echogenic foci: none (0)

ACR TI-RADS total points: 3.

ACR TI-RADS risk category: TR3 (3 points).

ACR TI-RADS recommendations:

Given size (<1.4 cm) and appearance, this nodule does NOT meet
TI-RADS criteria for biopsy or dedicated follow-up.

_________________________________________________________

0.7 cm isoechoic superior left nodule without calcifications; This
nodule does NOT meet TI-RADS criteria for biopsy or dedicated
follow-up.

Nodule # 5:

Location: Left; Inferior

Maximum size: 0.5 cm; Other 2 dimensions:   0.4 x 0.3 cm

Composition: solid/almost completely solid (2)

Echogenicity: hyperechoic (1)

Shape: not taller-than-wide (0)

Margins: ill-defined (0)

Echogenic foci: punctate echogenic foci (3)

ACR TI-RADS total points: 6.

ACR TI-RADS risk category: TR4 (4-6 points).

ACR TI-RADS recommendations:

Given size (<0.9 cm) and appearance, this nodule does NOT meet
TI-RADS criteria for biopsy or dedicated follow-up.

_________________________________________________________
IMPRESSION: 1. Normal-sized thyroid with bilateral nodules.
2. Recommend FNA biopsy of 1.4 cm highly suspicious (TR5)
posteroinferior right nodule.
3. Additional nodules do not meet criteria for biopsy or follow-up.

The above is in keeping with the ACR TI-RADS recommendations - [HOSPITAL] 4356;[DATE].

## 2021-01-03 ENCOUNTER — Other Ambulatory Visit: Payer: Self-pay

## 2021-01-03 ENCOUNTER — Ambulatory Visit
Admission: RE | Admit: 2021-01-03 | Discharge: 2021-01-03 | Disposition: A | Discharge status: Home / Self Care | Payer: Medicaid Other | Source: Ambulatory Visit | Attending: Physical Medicine & Rehabilitation | Admitting: Physical Medicine & Rehabilitation

## 2021-01-03 DIAGNOSIS — G8929 Other chronic pain: Secondary | ICD-10-CM | POA: Diagnosis present

## 2021-01-03 DIAGNOSIS — M5442 Lumbago with sciatica, left side: Secondary | ICD-10-CM | POA: Diagnosis not present

## 2021-03-26 ENCOUNTER — Ambulatory Visit (INDEPENDENT_AMBULATORY_CARE_PROVIDER_SITE_OTHER): Payer: Medicaid Other | Admitting: Podiatry

## 2021-03-26 ENCOUNTER — Ambulatory Visit (INDEPENDENT_AMBULATORY_CARE_PROVIDER_SITE_OTHER): Payer: Medicaid Other

## 2021-03-26 ENCOUNTER — Other Ambulatory Visit: Payer: Self-pay

## 2021-03-26 ENCOUNTER — Encounter: Payer: Self-pay | Admitting: Podiatry

## 2021-03-26 DIAGNOSIS — M722 Plantar fascial fibromatosis: Secondary | ICD-10-CM

## 2021-03-26 DIAGNOSIS — M7672 Peroneal tendinitis, left leg: Secondary | ICD-10-CM

## 2021-03-26 MED ORDER — BETAMETHASONE SOD PHOS & ACET 6 (3-3) MG/ML IJ SUSP
3.0000 mg | Freq: Once | INTRAMUSCULAR | Status: AC
  Administered 2021-03-26: 3 mg via INTRA_ARTICULAR

## 2021-03-26 MED ORDER — METHYLPREDNISOLONE 4 MG PO TBPK: ORAL_TABLET | ORAL | 0 refills | Status: DC

## 2021-03-26 MED ORDER — MELOXICAM 15 MG PO TABS: 15.0000 mg | ORAL_TABLET | Freq: Every day | ORAL | 1 refills | Status: DC

## 2021-03-26 NOTE — Progress Notes (Signed)
   Subjective: 55 y.o. female presenting today for new complaint regarding bilateral heel pain is been going on for several months.  Patient was last seen here in the office on 08/16/2019.  She states that the injection she received last visit helped for approximately 1 month.  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  She currently takes gabapentin for the pain as per her neuro spine specialist.  She presents for further treatment and evaluation   Past Medical History:  Diagnosis Date   Anemia    Arthritis    Depression    Hypothyroidism    Memory changes    Migraines    Silent myocardial ischemia    Vision changes      Objective: Physical Exam General: The patient is alert and oriented x3 in no acute distress.  Dermatology: Skin is warm, dry and supple bilateral lower extremities. Negative for open lesions or macerations bilateral.   Vascular: Dorsalis Pedis and Posterior Tibial pulses palpable bilateral.  Capillary fill time is immediate to all digits.  Neurological: Epicritic and protective threshold intact bilateral.   Musculoskeletal: Tenderness to palpation to the plantar aspect of the bilateral heels along the plantar fascia. All other joints range of motion within normal limits bilateral. Strength 5/5 in all groups bilateral.   Pain on palpation along the peroneal tendon distally as it inserts on the fifth metatarsal tubercle and is the peroneus longus wraps around the plantar aspect of the foot  Radiographic exam: Normal osseous mineralization. Joint spaces preserved. No fracture/dislocation/boney destruction. No other soft tissue abnormalities or radiopaque foreign bodies.   Assessment: 1. plantar fasciitis bilateral feet 2.  Insertional peroneal tendinitis left  Plan of Care:  1. Patient evaluated. Xrays reviewed.   2. Injection of 0.5cc Celestone soluspan injected into the bilateral heels and peroneal  tendon sheath left.  3. Rx for Medrol Dose Pak placed 4. Rx for Meloxicam ordered for patient. 5. Plantar fascial band(s) dispensed for bilateral plantar fasciitis. 6. Instructed patient regarding therapies and modalities at home to alleviate symptoms.  7. Return to clinic in 4 weeks.    *Currently not working.  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  Edrick Kins, DPM [facility]  Dr. Edrick Kins, DPM    [address], [place] [address]                Office [phone]  Fax [phone]

## 2021-04-23 ENCOUNTER — Other Ambulatory Visit: Payer: Self-pay

## 2021-04-23 ENCOUNTER — Ambulatory Visit (INDEPENDENT_AMBULATORY_CARE_PROVIDER_SITE_OTHER): Payer: Medicaid Other | Admitting: Podiatry

## 2021-04-23 DIAGNOSIS — M722 Plantar fascial fibromatosis: Secondary | ICD-10-CM | POA: Diagnosis not present

## 2021-04-23 DIAGNOSIS — M7672 Peroneal tendinitis, left leg: Secondary | ICD-10-CM

## 2021-04-23 MED ORDER — BETAMETHASONE SOD PHOS & ACET 6 (3-3) MG/ML IJ SUSP
3.0000 mg | Freq: Once | INTRAMUSCULAR | Status: AC
  Administered 2021-04-23: 3 mg via INTRA_ARTICULAR

## 2021-04-23 NOTE — Progress Notes (Signed)
   Subjective: 55 y.o. female presenting today for for follow-up evaluation of plantar fasciitis to the bilateral feet as well as peroneal tendinitis to the left lower extremity.  Patient states that she is doing much better.  The injections helped significantly.  She still having some slight tenderness to the lateral aspect of the left foot.  She states that she had to discontinue the Medrol Dosepak because she had some side effects with coughing and wheezing.   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  She currently takes gabapentin for the pain as per her neuro spine specialist.  She presents for further treatment and evaluation   Past Medical History:  Diagnosis Date   Anemia    Arthritis    Depression    Hypothyroidism    Memory changes    Migraines    Silent myocardial ischemia    Vision changes      Objective: Physical Exam General: The patient is alert and oriented x3 in no acute distress.  Dermatology: Skin is warm, dry and supple bilateral lower extremities. Negative for open lesions or macerations bilateral.   Vascular: Dorsalis Pedis and Posterior Tibial pulses palpable bilateral.  Capillary fill time is immediate to all digits.  Neurological: Epicritic and protective threshold intact bilateral.   Musculoskeletal: Negative for any significant tenderness to palpation to the plantar aspect of the bilateral heels along the plantar fascia. All other joints range of motion within normal limits bilateral. Strength 5/5 in all groups bilateral.   Pain on palpation along the peroneal tendon distally as it inserts on the fifth metatarsal tubercle and is the peroneus longus wraps around the plantar aspect of the foot   Assessment: 1. plantar fasciitis bilateral feet; resolved 2.  Insertional peroneal tendinitis left  Plan of Care:  1. Patient evaluated. Xrays reviewed.   2. Injection of 0.5cc Celestone soluspan  injected into the peroneal tendon sheath left.  3.  Patient had wheezing and coughing associated to the Medrol Dosepak. 4.  Continue meloxicam 15 mg daily as needed 5.  Continue plantar fascial braces  6.  Return to clinic as needed  *Currently not working.  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  Edrick Kins, DPM [facility]  Dr. Edrick Kins, DPM    [address], [place] [address]                Office [phone]  Fax [phone]

## 2021-07-09 ENCOUNTER — Other Ambulatory Visit: Payer: Self-pay

## 2021-07-09 ENCOUNTER — Ambulatory Visit: Payer: Medicaid Other | Admitting: Podiatry

## 2021-07-09 DIAGNOSIS — M7662 Achilles tendinitis, left leg: Secondary | ICD-10-CM | POA: Diagnosis not present

## 2021-07-09 DIAGNOSIS — M7672 Peroneal tendinitis, left leg: Secondary | ICD-10-CM

## 2021-07-09 MED ORDER — BETAMETHASONE SOD PHOS & ACET 6 (3-3) MG/ML IJ SUSP
3.0000 mg | Freq: Once | INTRAMUSCULAR | Status: AC
  Administered 2021-07-09: 3 mg via INTRA_ARTICULAR

## 2021-07-09 NOTE — Progress Notes (Signed)
   Subjective: 55 y.o. female presenting today for for follow-up evaluation of plantar fasciitis to the bilateral feet as well as peroneal tendinitis to the left lower extremity.  Patient continues to do well with the heel pain.  She says the injection she received to the peroneal tendon sheath helped for about 2-3 weeks.  The pain slowly recurred.  She presents for further treatment and evaluation  Past Medical History:  Diagnosis Date   Anemia    Arthritis    Depression    Hypothyroidism    Memory changes    Migraines    Silent myocardial ischemia    Vision changes      Objective: Physical Exam General: The patient is alert and oriented x3 in no acute distress.  Dermatology: Skin is warm, dry and supple bilateral lower extremities. Negative for open lesions or macerations bilateral.   Vascular: Dorsalis Pedis and Posterior Tibial pulses palpable bilateral.  Capillary fill time is immediate to all digits.  Neurological: Epicritic and protective threshold intact bilateral.   Musculoskeletal: Negative for any significant tenderness to palpation to the plantar aspect of the bilateral heels along the plantar fascia. All other joints range of motion within normal limits bilateral. Strength 5/5 in all groups bilateral.   Pain on palpation along the peroneal tendon distally as it inserts on the fifth metatarsal tubercle and is the peroneus longus wraps around the plantar aspect of the foot   Assessment: 1. plantar fasciitis bilateral feet; resolved 2.  Insertional peroneal tendinitis left  Plan of Care:  1. Patient evaluated.   2. Injection of 0.5cc Celestone soluspan injected into the peroneal tendon sheath left.  3.  Patient had wheezing and coughing associated to the Medrol Dosepak. 4.  Patient is currently on gabapentin and meloxicam per her neuro spine specialist.  Continue. 5.  Continue plantar fascial brace right foot.  Today we are going to dispense an ankle brace to the  left to help support the peroneal tendons and limit the motion of the ankle joint.  I do not want to put the patient in a cam boot since she has a history of lumbar spine problems which may exacerbate her spine issues 6.  Return to clinic 4 weeks, if there is no improvement we may need to consider MRI to rule out peroneal tendon tear  *Currently not working.  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  Edrick Kins, DPM [facility]  Dr. Edrick Kins, DPM    [address], [place] [address]                Office [phone]  Fax [phone]

## 2021-07-10 DIAGNOSIS — M79676 Pain in unspecified toe(s): Secondary | ICD-10-CM

## 2021-07-30 ENCOUNTER — Ambulatory Visit: Payer: Medicaid Other | Admitting: Podiatry

## 2021-07-30 ENCOUNTER — Other Ambulatory Visit: Payer: Self-pay

## 2021-07-30 DIAGNOSIS — M7672 Peroneal tendinitis, left leg: Secondary | ICD-10-CM

## 2021-07-30 NOTE — Progress Notes (Signed)
   Subjective: 55 y.o. female presenting today for for follow-up evaluation of plantar fasciitis to the bilateral feet as well as peroneal tendinitis to the left lower extremity.  Patient continues to do well with the heel pain.    Patient states that she does not want another injection.  She says the injections only helped temporarily.  She continues to have pain and tenderness to the left peroneal tendon area.  As discussed last visit she would like to pursue MRI.  I agree.  No new complaints at this time  Past Medical History:  Diagnosis Date   Anemia    Arthritis    Depression    Hypothyroidism    Memory changes    Migraines    Silent myocardial ischemia    Vision changes      Objective: Physical Exam General: The patient is alert and oriented x3 in no acute distress.  Dermatology: Skin is warm, dry and supple bilateral lower extremities. Negative for open lesions or macerations bilateral.   Vascular: Dorsalis Pedis and Posterior Tibial pulses palpable bilateral.  Capillary fill time is immediate to all digits.  Neurological: Epicritic and protective threshold intact bilateral.   Musculoskeletal: Negative for any significant tenderness to palpation to the plantar aspect of the bilateral heels along the plantar fascia. All other joints range of motion within normal limits bilateral. Strength 5/5 in all groups bilateral.   Pain on palpation along the peroneal tendon distally as it inserts on the fifth metatarsal tubercle and is the peroneus longus wraps around the plantar aspect of the foot   Assessment: 1. plantar fasciitis bilateral feet; resolved 2.  Insertional peroneal tendinitis left  Plan of Care:  1. Patient evaluated.   2.  Patient continues to experience pain and tenderness despite conservative treatment modalities.  Today were going to order MRI left ankle to rule out peroneal tendon tear. 3.  Continue ankle brace left 4.  Continue gabapentin and meloxicam per  neuro spine specialist. 5.  Return to clinic after MRI to review results and discuss further treatment options  *Currently not working.  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  Edrick Kins, DPM [facility]  Dr. Edrick Kins, DPM    [address], [place] [address]                Office [phone]  Fax [phone]

## 2021-09-30 ENCOUNTER — Encounter: Payer: Self-pay | Admitting: Podiatry

## 2021-10-01 NOTE — Telephone Encounter (Signed)
Please advise 

## 2021-10-02 NOTE — Telephone Encounter (Signed)
I've asked Katherine Brennan to f/u on the MRI scheduling / pre cert

## 2021-10-02 NOTE — Telephone Encounter (Signed)
Please advise 

## 2021-10-03 NOTE — Telephone Encounter (Signed)
Thank you :)

## 2021-10-11 ENCOUNTER — Telehealth: Payer: Self-pay | Admitting: *Deleted

## 2021-10-11 NOTE — Telephone Encounter (Signed)
I called [facility] to get authorization for the MRI of the left ankle, [id number], for Ms. Katherine Brennan.  I spoke to [place]  She said clinicals needed to be sent to them.  She also informed me that authorization had to be requested via [employer] ([employer]).  The phone number for [employer] is [phone].  The call reference number is [id number].  I called [employer] and spoke to [facility].  The request needed further review to determine authorization.  She asked me to fax clinicals to [phone].  The tracking # is [id number]   I faxed clinicals to [employer] and to [facility] to request prior authorization for the MRI of the left ankle.

## 2021-10-16 ENCOUNTER — Encounter: Payer: Self-pay | Admitting: Podiatry

## 2021-10-16 NOTE — Telephone Encounter (Addendum)
I attempted to call the patient and inform her that her that Medicaid will not authorize the MRI without more conservative treatment and physical therapy.  Dr. Amalia Hailey wants her to follow up with him.  She was not at home and she did not answer her mobile phone.  I could not leave a message because her voicemail was not set up.

## 2021-10-17 NOTE — Telephone Encounter (Signed)
I left a message with Ms. Ogawa's mother.  I asked her to inform Junetta that her insurance denied the MRI.  I asked her to tell Ms. Glantz to all the office and schedule an appointment with Dr. Amalia Hailey.

## 2021-11-12 ENCOUNTER — Ambulatory Visit: Payer: Medicaid Other | Admitting: Podiatry

## 2021-11-12 ENCOUNTER — Other Ambulatory Visit: Payer: Self-pay

## 2021-11-12 ENCOUNTER — Encounter: Payer: Self-pay | Admitting: Podiatry

## 2021-11-12 DIAGNOSIS — M7672 Peroneal tendinitis, left leg: Secondary | ICD-10-CM | POA: Diagnosis not present

## 2021-11-12 MED ORDER — BETAMETHASONE SOD PHOS & ACET 6 (3-3) MG/ML IJ SUSP
3.0000 mg | Freq: Once | INTRAMUSCULAR | Status: AC
  Administered 2021-11-12: 3 mg via INTRA_ARTICULAR

## 2021-11-12 MED ORDER — METHYLPREDNISOLONE 4 MG PO TBPK: ORAL_TABLET | ORAL | 0 refills | Status: DC

## 2021-11-12 NOTE — Progress Notes (Signed)
° °  Subjective: 56 y.o. female presenting today for for follow-up evaluation of peroneal tendinitis to the left lower extremity.  Last visit on 07/30/2021 we had ordered an MRI however this was not approved.  We are going to continue conservative treatment for now.  Past Medical History:  Diagnosis Date   Anemia    Arthritis    Depression    Hypothyroidism    Memory changes    Migraines    Silent myocardial ischemia    Vision changes      Objective: Physical Exam General: The patient is alert and oriented x3 in no acute distress.  Dermatology: Skin is warm, dry and supple bilateral lower extremities. Negative for open lesions or macerations bilateral.   Vascular: Dorsalis Pedis and Posterior Tibial pulses palpable bilateral.  Capillary fill time is immediate to all digits.  Neurological: Epicritic and protective threshold intact bilateral.   Musculoskeletal: Negative for any significant tenderness to palpation to the plantar aspect of the bilateral heels along the plantar fascia. All other joints range of motion within normal limits bilateral. Strength 5/5 in all groups bilateral.   Pain on palpation along the peroneal tendon distally as it inserts on the fifth metatarsal tubercle and is the peroneus longus wraps around the plantar aspect of the foot   Assessment: 1. plantar fasciitis bilateral feet; resolved 2.  Insertional peroneal tendinitis left  Plan of Care:  1. Patient evaluated.   2.  Unfortunately the MRI that was ordered was not approved to the left foot.  I stated that we have not attempted enough conservative treatment. 3.  Injection of 0.5 cc Celestone Soluspan injected into the insertional area of the peroneal tendon as it inserts on the fifth metatarsal tubercle. 4.  Continue ankle brace as needed 5.  Continue gabapentin, meloxicam, and Cymbalta as per PCP and neuro spine specialist 6.  Order placed for physical therapy at [facility] 7.  Return to clinic in 6  weeks.  If the patient continues to have pain and has not improved we will resubmit for MRI  *Currently not working.  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  Edrick Kins, DPM [facility]  Dr. Edrick Kins, DPM    [address], [place] [address]                Office [phone]  Fax [phone]

## 2021-11-19 ENCOUNTER — Other Ambulatory Visit: Payer: Self-pay

## 2021-11-19 ENCOUNTER — Ambulatory Visit
Admission: EM | Admit: 2021-11-19 | Discharge: 2021-11-19 | Disposition: A | Discharge status: Home / Self Care | Payer: Medicaid Other | Attending: Emergency Medicine | Admitting: Emergency Medicine

## 2021-11-19 DIAGNOSIS — R220 Localized swelling, mass and lump, head: Secondary | ICD-10-CM

## 2021-11-19 DIAGNOSIS — K047 Periapical abscess without sinus: Secondary | ICD-10-CM

## 2021-11-19 MED ORDER — CEFTRIAXONE SODIUM 1 G IJ SOLR
1.0000 g | Freq: Once | INTRAMUSCULAR | Status: AC
Start: 2021-11-19 — End: 2021-11-19
  Administered 2021-11-19: 1 g via INTRAMUSCULAR

## 2021-11-19 MED ORDER — IBUPROFEN 600 MG PO TABS: 600.0000 mg | ORAL_TABLET | Freq: Four times a day (QID) | ORAL | 0 refills | Status: AC | PRN

## 2021-11-19 MED ORDER — AMOXICILLIN-POT CLAVULANATE 875-125 MG PO TABS: 1.0000 | ORAL_TABLET | Freq: Two times a day (BID) | ORAL | 0 refills | Status: AC

## 2021-11-19 NOTE — Discharge Instructions (Addendum)
Warm compresses, 600 mg of ibuprofen combined with 1000 mg of Tylenol together 3-4 times a day as needed for pain, finish the antibiotics, even if you feel better.  Please follow-up with a dental professional tomorrow.  Go immediately to the ER if you get worse tonight, severe headache, or if you are not better in 24 hours

## 2021-11-19 NOTE — ED Triage Notes (Signed)
Pt c/o swelling along the left side of her face. Pt states that 2 days ago she had soreness between her nose and mouth on the left side. Pt states that during her wait for her visit, her lips have started to swell as well.   Pt is unsure if she has an absess. Pt states that when running her tongue along her upper gum, she feels ridges that feel like a "popped ulcer".   Pt states that the pain is going out toward her left ear, nose, and left eye.  Pt is having no swelling in her tongue nor difficulty breathing or swallowing. Pt states that it hurts pressing her tongue into her gums and cheek.   Pt states that she is having left side tooth pain x2days Pt sees a dentist on March 7th, 2023.

## 2021-11-19 NOTE — ED Provider Notes (Signed)
HPI  SUBJECTIVE:  Katherine Brennan is a 56 y.o. female who presents with 3 days of left upper dental pain, 2 days of left cheek pain and swelling that got acutely worse today.  Reports pain with opening her mouth, but is able to do so.  No fevers, nasal congestion, rhinorrhea, facial rash.  She states that she feels as if the swelling is moving towards her eye.  She denies any other facial or ear pain.  No preceding headaches.  She has not tried anything for this.  No alleviating factors.  Symptoms are worse with palpation and with opening her mouth.  She has a past medical history of hypothyroidism.  No history of diabetes, hypertension, MRSA, immunocompromise.  PCP: [facility].  She has an appointment with her dentist on 3/7.   Past Medical History:  Diagnosis Date   Anemia    Arthritis    Depression    Hypothyroidism    Memory changes    Migraines    Silent myocardial ischemia    Vision changes     Past Surgical History:  Procedure Laterality Date   CESAREAN SECTION  1987, 1990, 2007   COLONOSCOPY WITH PROPOFOL N/A 06/25/2017   Procedure: COLONOSCOPY WITH PROPOFOL;  Surgeon: Lucilla Lame, MD;  Location: [place];  Service: Gastroenterology;  Laterality: N/A;   ESOPHAGOGASTRODUODENOSCOPY (EGD) WITH PROPOFOL N/A 06/25/2017   Procedure: ESOPHAGOGASTRODUODENOSCOPY (EGD) WITH PROPOFOL;  Surgeon: Lucilla Lame, MD;  Location: [place];  Service: Gastroenterology;  Laterality: N/A;   POLYPECTOMY N/A 06/25/2017   Procedure: POLYPECTOMY;  Surgeon: Lucilla Lame, MD;  Location: [place];  Service: Gastroenterology;  Laterality: N/A;    Family History  Problem Relation Age of Onset   CAD Father    Breast cancer Neg Hx     Social History   Tobacco Use   Smoking status: Former    Types: Cigarettes    Quit date: 2008    Years since quitting: 15.1   Smokeless tobacco: Never  Vaping Use   Vaping Use: Never used  Substance Use Topics   Alcohol use:  Never    Comment: socially   Drug use: No    No current facility-administered medications for this encounter.  Current Outpatient Medications:    amoxicillin-clavulanate (AUGMENTIN) 875-125 MG tablet, Take 1 tablet by mouth 2 (two) times daily for 7 days., Disp: 14 tablet, Rfl: 0   cyclobenzaprine (FLEXERIL) 10 MG tablet, cyclobenzaprine 10 mg tablet  Take 1 tablet 3 times a day by oral route., Disp: , Rfl:    ibuprofen (ADVIL) 600 MG tablet, Take 1 tablet (600 mg total) by mouth every 6 (six) hours as needed., Disp: 30 tablet, Rfl: 0   levothyroxine (SYNTHROID) 150 MCG tablet, Take by mouth., Disp: , Rfl:    gabapentin (NEURONTIN) 300 MG capsule, Take 1 capsule by mouth 3 (three) times daily., Disp: , Rfl:    levothyroxine (SYNTHROID, LEVOTHROID) 125 MCG tablet, Take 125 mcg by mouth daily., Disp: , Rfl:    losartan (COZAAR) 25 MG tablet, Take 1 tablet by mouth daily., Disp: , Rfl:   Allergies  Allergen Reactions   Enalapril Cough     ROS  As noted in HPI.   Physical Exam  BP (!) 148/106 (BP Location: Left Arm)    Pulse 87    Temp 98.6 F (37 C) (Oral)    Resp 18    Ht 5\' 5"  (1.651 m)    Wt 109.8 kg  LMP 08/06/2017    SpO2 98%    BMI 40.27 kg/m   Constitutional: Well developed, well nourished, no acute distress Eyes:  EOMI, conjunctiva normal bilaterally.  No periorbital erythema, edema, tenderness. HENT: Positive left cheek swelling, tenderness with slight loss of the nasolabial fold.  No overlying erythema.  Positive left upper canine and left upper first bicuspid tender with gingival tenderness.  Dentition appears intact.  No gingival swelling, expressible purulent drainage.    Respiratory: Normal inspiratory effort Cardiovascular: Normal rate GI: nondistended skin: No rash, skin intact Musculoskeletal: no deformities Neurologic: Alert & oriented x 3, no focal neuro deficits Psychiatric: Speech and behavior appropriate   ED Course   Medications  cefTRIAXone  (ROCEPHIN) injection 1 g (1 g Intramuscular Given 11/19/21 2013)    No orders of the defined types were placed in this encounter.   No results found for this or any previous visit (from the past 24 hour(s)). No results found.  ED Clinical Impression  1. Dental infection   2. Facial swelling      ED Assessment/Plan  Concern for dental infection extending up into the cheek.  There may be an early canine space abscess.  No evidence of cavernous sinus thrombosis at this time.  Giving 1 g of Rocephin.  Sending home on Augmentin, Tylenol/ibuprofen, warm compresses.  Advised follow-up with a dental professional tomorrow, such as urgent tooth in [facility] or with [facility] dental clinic. She will go to the ER if not better by tomorrow or if she gets worse in the interim.  Discussed importance of follow-up with patient.  Discussed medical decision making, treatment plan and plan for follow-up with patient. Patient agrees with plan.  Meds ordered this encounter  Medications   cefTRIAXone (ROCEPHIN) injection 1 g   amoxicillin-clavulanate (AUGMENTIN) 875-125 MG tablet    Sig: Take 1 tablet by mouth 2 (two) times daily for 7 days.    Dispense:  14 tablet    Refill:  0   ibuprofen (ADVIL) 600 MG tablet    Sig: Take 1 tablet (600 mg total) by mouth every 6 (six) hours as needed.    Dispense:  30 tablet    Refill:  0      *This clinic note was created using [employer]. Therefore, there may be occasional mistakes despite careful proofreading.  ?    Melynda Ripple, MD 11/20/21 1245

## 2021-12-24 ENCOUNTER — Ambulatory Visit: Payer: Medicaid Other | Admitting: Podiatry

## 2021-12-24 ENCOUNTER — Other Ambulatory Visit: Payer: Self-pay

## 2021-12-24 DIAGNOSIS — M7672 Peroneal tendinitis, left leg: Secondary | ICD-10-CM | POA: Diagnosis not present

## 2021-12-24 NOTE — Progress Notes (Signed)
? ?  Subjective: ?56 y.o. female presenting today for for follow-up evaluation of peroneal tendinitis to the left lower extremity.  Patient states that overall she is doing much better.  No new complaints at this time ? ?Past Medical History:  ?Diagnosis Date  ? Anemia   ? Arthritis   ? Depression   ? Hypothyroidism   ? Memory changes   ? Migraines   ? Silent myocardial ischemia   ? Vision changes   ? ?Past Surgical History:  ?Procedure Laterality Date  ? [place], 2007  ? COLONOSCOPY WITH PROPOFOL N/A 06/25/2017  ? Procedure: COLONOSCOPY WITH PROPOFOL;  Surgeon: Lucilla Lame, MD;  Location: [place];  Service: Gastroenterology;  Laterality: N/A;  ? ESOPHAGOGASTRODUODENOSCOPY (EGD) WITH PROPOFOL N/A 06/25/2017  ? Procedure: ESOPHAGOGASTRODUODENOSCOPY (EGD) WITH PROPOFOL;  Surgeon: Lucilla Lame, MD;  Location: [place];  Service: Gastroenterology;  Laterality: N/A;  ? POLYPECTOMY N/A 06/25/2017  ? Procedure: POLYPECTOMY;  Surgeon: Lucilla Lame, MD;  Location: [place];  Service: Gastroenterology;  Laterality: N/A;  ? ?Allergies  ?Allergen Reactions  ? Enalapril Cough  ? ? ? ?Objective: ?Physical Exam ?General: The patient is alert and oriented x3 in no acute distress. ? ?Dermatology: Skin is warm, dry and supple bilateral lower extremities. Negative for open lesions or macerations bilateral.  ? ?Vascular: Dorsalis Pedis and Posterior Tibial pulses palpable bilateral.  Capillary fill time is immediate to all digits. ? ?Neurological: Epicritic and protective threshold intact bilateral.  ? ?Musculoskeletal: Negative for any significant tenderness to palpation to the plantar aspect of the bilateral heels along the plantar fascia. All other joints range of motion within normal limits bilateral. Strength 5/5 in all groups bilateral.  ? ?There continues to be some residual pain on palpation along the peroneal tendon distally as it inserts on the fifth metatarsal tubercle and  is the peroneus longus wraps around the plantar aspect of the foot ? ? ?Assessment: ?1. plantar fasciitis bilateral feet; resolved ?2.  Insertional peroneal tendinitis left ? ?Plan of Care:  ?1. Patient evaluated.   ?2.  Overall the patient is doing much better ?3.  Patient declined steroid injection today ?4.  Continue gabapentin, meloxicam, and Cymbalta as per neuro spine specialist and PCP ?5.  Continue ankle brace daily ?6.  Return to clinic as needed ? ?*Currently not working.  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 ? ?Edrick Kins, DPM ?[place] ? ?Dr. Edrick Kins, DPM  ?  ?[address]                                   ?[place], [place] [address]                ?Office [phone]  ?Fax [phone] ? ? ? ? ?

## 2022-03-04 ENCOUNTER — Encounter: Payer: Medicaid Other | Admitting: Podiatry

## 2022-03-04 ENCOUNTER — Encounter: Payer: Self-pay | Admitting: Podiatry

## 2022-03-04 DIAGNOSIS — M7672 Peroneal tendinitis, left leg: Secondary | ICD-10-CM

## 2022-03-04 NOTE — Progress Notes (Signed)
Patient left without being seen.

## 2022-03-11 ENCOUNTER — Ambulatory Visit: Payer: Medicaid Other | Admitting: Podiatry

## 2022-05-01 ENCOUNTER — Other Ambulatory Visit (HOSPITAL_COMMUNITY): Payer: Self-pay | Admitting: Physical Medicine & Rehabilitation

## 2022-05-01 ENCOUNTER — Other Ambulatory Visit: Payer: Self-pay | Admitting: Physical Medicine & Rehabilitation

## 2022-05-01 DIAGNOSIS — G8929 Other chronic pain: Secondary | ICD-10-CM

## 2022-05-09 ENCOUNTER — Ambulatory Visit: Admission: RE | Admit: 2022-05-09 | Payer: Medicaid Other | Source: Ambulatory Visit

## 2022-05-16 ENCOUNTER — Ambulatory Visit: Admission: RE | Admit: 2022-05-16 | Payer: Medicaid Other | Source: Ambulatory Visit

## 2022-05-16 ENCOUNTER — Ambulatory Visit
Admission: RE | Admit: 2022-05-16 | Discharge: 2022-05-16 | Disposition: A | Discharge status: Home / Self Care | Payer: Medicaid Other | Source: Ambulatory Visit | Attending: Physical Medicine & Rehabilitation | Admitting: Physical Medicine & Rehabilitation

## 2022-05-16 DIAGNOSIS — G8929 Other chronic pain: Secondary | ICD-10-CM | POA: Diagnosis present

## 2022-05-16 DIAGNOSIS — M5441 Lumbago with sciatica, right side: Secondary | ICD-10-CM | POA: Insufficient documentation

## 2022-07-15 ENCOUNTER — Ambulatory Visit: Payer: Medicaid Other | Admitting: Podiatry

## 2022-07-15 DIAGNOSIS — M7672 Peroneal tendinitis, left leg: Secondary | ICD-10-CM | POA: Diagnosis not present

## 2022-07-15 MED ORDER — BETAMETHASONE SOD PHOS & ACET 6 (3-3) MG/ML IJ SUSP
3.0000 mg | Freq: Once | INTRAMUSCULAR | Status: AC
  Administered 2022-07-15: 3 mg via INTRA_ARTICULAR

## 2022-07-15 NOTE — Progress Notes (Signed)
   Subjective: 56 y.o. female presenting today for for follow-up evaluation of peroneal tendinitis to the left lower extremity.  Patient was last seen in the office about 6 months ago.  Since that time she has noticed increased pain and swelling to the peroneal tendon area of the foot.  She presents for further treatment and evaluation  Past Medical History:  Diagnosis Date   Anemia    Arthritis    Depression    Hypothyroidism    Memory changes    Migraines    Silent myocardial ischemia    Vision changes    Past Surgical History:  Procedure Laterality Date   CESAREAN SECTION  1987, 1990, 2007   COLONOSCOPY WITH PROPOFOL N/A 06/25/2017   Procedure: COLONOSCOPY WITH PROPOFOL;  Surgeon: Lucilla Lame, MD;  Location: [place];  Service: Gastroenterology;  Laterality: N/A;   ESOPHAGOGASTRODUODENOSCOPY (EGD) WITH PROPOFOL N/A 06/25/2017   Procedure: ESOPHAGOGASTRODUODENOSCOPY (EGD) WITH PROPOFOL;  Surgeon: Lucilla Lame, MD;  Location: [place];  Service: Gastroenterology;  Laterality: N/A;   POLYPECTOMY N/A 06/25/2017   Procedure: POLYPECTOMY;  Surgeon: Lucilla Lame, MD;  Location: [place];  Service: Gastroenterology;  Laterality: N/A;   Allergies  Allergen Reactions   Enalapril Cough     Objective: Physical Exam General: The patient is alert and oriented x3 in no acute distress.  Dermatology: Skin is warm, dry and supple bilateral lower extremities. Negative for open lesions or macerations bilateral.   Vascular: Dorsalis Pedis and Posterior Tibial pulses palpable bilateral.  Capillary fill time is immediate to all digits.  Neurological: Epicritic and protective threshold intact bilateral.   Musculoskeletal: pain on palpation along the peroneal tendon distally as it inserts on the fifth metatarsal tubercle and is the peroneus longus wraps around the plantar aspect of the foot   Assessment: 1. plantar fasciitis bilateral feet; resolved 2.   Insertional peroneal tendinitis left  Plan of Care:  1. Patient evaluated.   2.  Overall the patient is doing much better 3.  Injection of 0.5 cc Celestone Soluspan injected around the fifth metatarsal tubercle of the left foot 4.  Continue gabapentin, meloxicam, and Cymbalta as per neuro spine specialist and PCP 5.  Cam dispensed.  Weightbearing as tolerated x4 weeks 6.  Return to clinic 4 weeks  *Currently not working.  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  Edrick Kins, DPM [facility]  Dr. Edrick Kins, DPM    [address], [place] [address]                Office [phone]  Fax [phone]

## 2022-07-22 ENCOUNTER — Ambulatory Visit: Payer: Medicaid Other | Admitting: Podiatry

## 2022-10-14 ENCOUNTER — Ambulatory Visit: Payer: Medicaid Other | Admitting: Podiatry

## 2022-10-14 VITALS — BP 146/88 | HR 86

## 2022-10-14 DIAGNOSIS — M7672 Peroneal tendinitis, left leg: Secondary | ICD-10-CM

## 2022-10-14 MED ORDER — BETAMETHASONE SOD PHOS & ACET 6 (3-3) MG/ML IJ SUSP
3.0000 mg | Freq: Once | INTRAMUSCULAR | Status: AC
  Administered 2022-10-14: 3 mg via INTRA_ARTICULAR

## 2022-10-14 NOTE — Progress Notes (Signed)
    Subjective: 57 y.o. female presenting today for for follow-up evaluation of peroneal tendinitis to the left lower extremity.  Patient states that the last injection did help significantly for about 2-1/2 months.  MRI was originally requested but denied by insurance.  Overall she is doing well however the pain has slowly returned over the last 2 weeks.  She is also dealing with lumbar and hip symptoms and currently being treated by neurology and Ortho  Past Medical History:  Diagnosis Date   Anemia    Arthritis    Depression    Hypothyroidism    Memory changes    Migraines    Silent myocardial ischemia    Vision changes    Past Surgical History:  Procedure Laterality Date   CESAREAN SECTION  1987, 1990, 2007   COLONOSCOPY WITH PROPOFOL N/A 06/25/2017   Procedure: COLONOSCOPY WITH PROPOFOL;  Surgeon: Lucilla Lame, MD;  Location: [place];  Service: Gastroenterology;  Laterality: N/A;   ESOPHAGOGASTRODUODENOSCOPY (EGD) WITH PROPOFOL N/A 06/25/2017   Procedure: ESOPHAGOGASTRODUODENOSCOPY (EGD) WITH PROPOFOL;  Surgeon: Lucilla Lame, MD;  Location: [place];  Service: Gastroenterology;  Laterality: N/A;   POLYPECTOMY N/A 06/25/2017   Procedure: POLYPECTOMY;  Surgeon: Lucilla Lame, MD;  Location: [place];  Service: Gastroenterology;  Laterality: N/A;   Allergies  Allergen Reactions   Enalapril Cough     Objective: Physical Exam General: The patient is alert and oriented x3 in no acute distress.  Dermatology: Skin is warm, dry and supple bilateral lower extremities. Negative for open lesions or macerations bilateral.   Vascular: Dorsalis Pedis and Posterior Tibial pulses palpable bilateral.  Capillary fill time is immediate to all digits.  Neurological: Epicritic and protective threshold intact bilateral.   Musculoskeletal: There continues to be pain on palpation along the peroneal tendon distally as it inserts on the fifth metatarsal tubercle and  is the peroneus longus wraps around the plantar aspect of the foot   Assessment: 1. plantar fasciitis bilateral feet; resolved 2.  Insertional peroneal tendinitis left  Plan of Care:  1. Patient evaluated.   2.  Injection of 0.5 cc Celestone Soluspan injected around the fifth metatarsal tubercle of the left foot 3.  Continue gabapentin, meloxicam, and Cymbalta as per neuro spine specialist and PCP 4.  Patient may now resume good supportive tennis shoes and sneakers.  She is no longer wearing the cam boot 5.  Return to clinic as needed  *Currently not working.  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  Edrick Kins, DPM [facility]  Dr. Edrick Kins, DPM    [address],  [address]                Office [phone]  Fax [phone]

## 2022-10-17 ENCOUNTER — Ambulatory Visit: Payer: Medicaid Other | Admitting: Podiatry

## 2022-10-28 ENCOUNTER — Encounter: Payer: Self-pay | Admitting: Student in an Organized Health Care Education/Training Program

## 2022-10-28 ENCOUNTER — Ambulatory Visit
Admission: RE | Admit: 2022-10-28 | Discharge: 2022-10-28 | Disposition: A | Discharge status: Home / Self Care | Payer: Medicaid Other | Source: Ambulatory Visit | Attending: Student in an Organized Health Care Education/Training Program | Admitting: Student in an Organized Health Care Education/Training Program

## 2022-10-28 ENCOUNTER — Ambulatory Visit
Payer: Medicaid Other | Attending: Student in an Organized Health Care Education/Training Program | Admitting: Student in an Organized Health Care Education/Training Program

## 2022-10-28 VITALS — BP 128/85 | HR 91 | Temp 97.3°F | Resp 17 | Ht 65.0 in | Wt 243.7 lb

## 2022-10-28 DIAGNOSIS — M5416 Radiculopathy, lumbar region: Secondary | ICD-10-CM | POA: Diagnosis not present

## 2022-10-28 DIAGNOSIS — G8929 Other chronic pain: Secondary | ICD-10-CM

## 2022-10-28 DIAGNOSIS — G5793 Unspecified mononeuropathy of bilateral lower limbs: Secondary | ICD-10-CM | POA: Diagnosis not present

## 2022-10-28 DIAGNOSIS — G600 Hereditary motor and sensory neuropathy: Secondary | ICD-10-CM

## 2022-10-28 DIAGNOSIS — G894 Chronic pain syndrome: Secondary | ICD-10-CM | POA: Diagnosis not present

## 2022-10-28 NOTE — Progress Notes (Signed)
Patient: Katherine Brennan  Service Category: E/M  Provider: Gillis Santa, MD  DOB: August 27, 1966  DOS: 10/28/2022  Referring Provider: Doyle Askew, MD  MRN: [id number]  Setting: Ambulatory outpatient  PCP: Sharyne Peach, MD  Type: New Patient  Specialty: Interventional Pain Management    Location: Office  Delivery: Face-to-face     Primary Reason(s) for Visit: Encounter for initial evaluation of one or more chronic problems (new to examiner) potentially causing chronic pain, and posing a threat to normal musculoskeletal function. (Level of risk: High) CC: Back Pain (lower) and Arm Pain (RIGHT )  HPI  [name] is a 57 y.o. year old, female patient, who comes for the first time to our practice referred by Girtha Hake I, MD for our initial evaluation of her chronic pain. She has Iron deficiency anemia; Monoclonal gammopathy of unknown significance (MGUS); Menorrhagia with regular cycle; Easy bruising; Monoclonal gammopathy; Positive ANA (antinuclear antibody); Mold exposure; Hypothyroidism (acquired); Goiter; DOE (dyspnea on exertion); CRP elevated; Chronic pain of left knee; Bilateral hand numbness; Arthralgia of multiple joints; Benign neoplasm of transverse colon; Other diseases of stomach and duodenum; Low back pain; Osteoarthritis of knee; Patellofemoral stress syndrome; Essential hypertension; Chronic radicular lumbar pain; Hereditary sensorimotor neuropathy; Chronic pain syndrome; and Neuropathic pain of both legs on their problem list. Today she comes in for evaluation of her Back Pain (lower) and Arm Pain (RIGHT )  Pain Assessment: Location: Lower Back Radiating: RIGHT radiates to "right below knee"; LEFT rasdiates to foot Onset: More than a month ago Duration: Chronic pain Quality: Shooting, Sharp Severity: 8 /10 (subjective, self-reported pain score)  Effect on ADL: limits daily activities, cannot sit or stand for extended periods Timing: Constant Modifying factors: nothing BP:  128/85  HR: 91  Onset and Duration: Gradual Cause of pain: Unknown Severity: Getting worse, NAS-11 at its worse: 9/10, NAS-11 at its best: 7/10, NAS-11 now: 8/10, and NAS-11 on the average: 8/10 Timing: Not influenced by the time of the day, During activity or exercise, After activity or exercise, and After a period of immobility Aggravating Factors: Bending, Climbing, Kneeling, Lifiting, Nerve blocks, Prolonged sitting, Prolonged standing, Squatting, Stooping , Twisting, Walking, Walking uphill, Walking downhill, and Working Alleviating Factors: Cold packs, Hot packs, Using a brace, and Warm showers or baths Associated Problems: Depression, Fatigue, Numbness, Personality changes, Sadness, Swelling, Tingling, and Pain that wakes patient up Quality of Pain: Aching, Agonizing, Annoying, Constant, Disabling, Distressing, Dull, Getting longer, Nagging, Pressure-like, Sharp, Shooting, Stabbing, Tender, and Uncomfortable Previous Examinations or Tests: CT scan, MRI scan, Nerve block, X-rays, and Neurosurgical evaluation Previous Treatments: Narcotic medications and Trigger point injections  [name] is a 57 year old female who presents with a chief complaint of low back, buttock pain with radiation into bilateral legs.  She states that her right leg pain radiates to right below her knee and that her left leg pain radiates to her foot.  She also describes paresthesias of bilateral feet.  She is being referred from Dr. Alba Destine for consideration of a spinal cord stimulator trial.  She has tried physical therapy in the past but had to stop because it was too painful.  She has been on gabapentin and Cymbalta in the past with limited response.  MRI of her lumbar spine from August 2023 shows L4-L5 right foraminal disc protrusion, L5-S1 central disc protrusion and mild facet arthropathy.  She also had an EMG done of her bilateral lower extremities in July 2023 which was abnormal and shows generalized sensorimotor  peripheral neuropathy.  Her interventional therapies with Dr. Alba Destine are below include  -s/p bilateral L5-S1 TFESI 01/28/2021 with temporary relief -s/p bilateral L4-5 and L5-S1 facet joint injections 08/01/2021 with 80% improvement for 4 days  -s/p bilateral L3, L4 medial branch and dorsal rami RFA 12/11/2021 with moderate relief   She has also had bilateral SI joint injections.  She is prediabetic, not on insulin.  If interested, Ms. Pounds's evaluation may include pharmacologic recommendations, but I no longer take patients for medication management. She had been informed that the initial visit was an evaluation only for SCS trial.  On the follow up appointment I will go over the results, including ordered tests.  At that time she will have the opportunity to decide whether to proceed with offered therapies or not. In the event that Ms. Kubly prefers avoiding interventional options, this will conclude our involvement in the case.   Meds   Current Outpatient Medications:    cyclobenzaprine (FLEXERIL) 10 MG tablet, cyclobenzaprine 10 mg tablet  Take 1 tablet 3 times a day by oral route., Disp: , Rfl:    DULoxetine (CYMBALTA) 30 MG capsule, Take 30 mg by mouth daily., Disp: , Rfl:    ibuprofen (ADVIL) 600 MG tablet, Take 1 tablet (600 mg total) by mouth every 6 (six) hours as needed., Disp: 30 tablet, Rfl: 0   levothyroxine (SYNTHROID) 200 MCG tablet, Take 200 mcg by mouth., Disp: , Rfl:    levothyroxine (SYNTHROID) 25 MCG tablet, Take 50 mcg by mouth daily before breakfast., Disp: , Rfl:    losartan (COZAAR) 25 MG tablet, Take 1 tablet by mouth daily., Disp: , Rfl:    gabapentin (NEURONTIN) 300 MG capsule, Take 1 capsule by mouth 3 (three) times daily., Disp: , Rfl:    levothyroxine (SYNTHROID, LEVOTHROID) 125 MCG tablet, Take 125 mcg by mouth daily., Disp: , Rfl:   Imaging Review   Lumbosacral Imaging: Lumbar MR wo contrast: Results for orders placed during the hospital encounter of  05/16/22  MR LUMBAR SPINE WO CONTRAST  Narrative CLINICAL DATA:  Chronic low back pain for over 10 years. Radiates bilaterally into the hips and buttocks, right greater than left.  EXAM: MRI LUMBAR SPINE WITHOUT CONTRAST  TECHNIQUE: Multiplanar, multisequence MR imaging of the lumbar spine was performed. No intravenous contrast was administered.  COMPARISON:  01/03/2021  FINDINGS: Segmentation:  Standard.  Alignment:  Physiologic.  Vertebrae: No acute fracture, evidence of discitis, or aggressive bone lesion.  Conus medullaris and cauda equina: Conus extends to the T12 level. Conus and cauda equina appear normal.  Paraspinal and other soft tissues: No acute paraspinal abnormality.  Disc levels:  Disc spaces: Degenerative disease with disc height loss at L3-4 and L5-S1.  T12-L1: No significant disc bulge. No neural foraminal stenosis. No central canal stenosis.  L1-L2: No significant disc bulge. No neural foraminal stenosis. No central canal stenosis.  L2-L3: No significant disc bulge. No neural foraminal stenosis. No central canal stenosis.  L3-L4: Minimal broad-based disc bulge. No foraminal or central canal stenosis.  L4-L5: Broad shallow right foraminal disc protrusion. No foraminal or central canal stenosis.  L5-S1: Broad-based disc bulge with a broad central disc protrusion. Mild bilateral facet arthropathy. No foraminal or central canal stenosis.  IMPRESSION: 1. At L4-5 there is a broad shallow right foraminal disc protrusion. 2. At L5-S1 there is a broad-based disc bulge with a broad central disc protrusion. Mild bilateral facet arthropathy. 3. No acute osseous injury of the lumbar spine.  Electronically Signed By: Kathreen Devoid M.D. On: 05/19/2022 08:47  DG Lumbar Spine 2-3 Views  Narrative CLINICAL DATA:  Low back pain  EXAM: LUMBAR SPINE - 2-3 VIEW  COMPARISON:  11/14/2005  FINDINGS: Three views of lumbar spine submitted. No acute  fracture or subluxation. Mild disc space flattening with anterior spurring at L3-L4 level. Mild disc space flattening at L5-S1 level. Alignment and vertebral body heights are preserved.  IMPRESSION: No acute fracture or subluxation.  Mild degenerative changes.   Electronically Signed By: Lahoma Crocker M.D. On: 08/04/2016 14:53  DG Knee Complete 4 Views Right  Narrative CLINICAL DATA:  Bilateral knee pain  EXAM: RIGHT KNEE - COMPLETE 4+ VIEW  COMPARISON:  None.  FINDINGS: Four views of the right knee submitted. No acute fracture or subluxation. No joint effusion. No radiopaque foreign body. Mild narrowing of medial joint compartment.  IMPRESSION: No acute fracture or subluxation. No joint effusion. Mild narrowing of medial joint compartment   Electronically Signed By: Lahoma Crocker M.D. On: 08/04/2016 14:54  EMG bilateral lower extremities done by Dr. Manuella Ghazi 04/2022: Abnormal electrodiagnostic study consistent with a generalized sensorimotor peripheral neuropathy   Complexity Note: Imaging results reviewed.                         ROS  Cardiovascular: Heart valve problems Pulmonary or Respiratory: Shortness of breath and Snoring  Neurological: No reported neurological signs or symptoms such as seizures, abnormal skin sensations, urinary and/or fecal incontinence, being born with an abnormal open spine and/or a tethered spinal cord Psychological-Psychiatric: Depressed Gastrointestinal: Reflux or heatburn Genitourinary: No reported renal or genitourinary signs or symptoms such as difficulty voiding or producing urine, peeing blood, non-functioning kidney, kidney stones, difficulty emptying the bladder, difficulty controlling the flow of urine, or chronic kidney disease Hematological: Weakness due to low blood hemoglobin or red blood cell count (Anemia) and Brusing easily Endocrine: Slow thyroid; pt states she is pre-diabetic Rheumatologic: Joint aches and or swelling due to  excess weight (Osteoarthritis) and Rheumatoid arthritis Musculoskeletal: Negative for myasthenia gravis, muscular dystrophy, multiple sclerosis or malignant hyperthermia Work History: Out of work due to pain  Allergies  Ms. Chesmore is allergic to enalapril.  Laboratory Chemistry Profile   Renal Lab Results  Component Value Date   BUN 16 04/29/2017   CREATININE 0.81 04/29/2017   GFRAA >60 04/29/2017   GFRNONAA >60 04/29/2017     Electrolytes Lab Results  Component Value Date   NA 135 04/29/2017   K 3.7 04/29/2017   CL 103 04/29/2017   CALCIUM 8.9 04/29/2017     Hepatic Lab Results  Component Value Date   AST 18 04/29/2017   ALT 10 (L) 04/29/2017   ALBUMIN 4.1 04/29/2017   ALKPHOS 32 (L) 04/29/2017     ID No results found for: "LYMEIGGIGMAB", "HIV", "SARSCOV2NAA", "STAPHAUREUS", "MRSAPCR", "HCVAB", "PREGTESTUR", "RMSFIGG", "QFVRPH1IGG", "QFVRPH2IGG"   Bone No results found for: "VD25OH", "JG283MO2HUT", "ML4650PT4", "SF6812XN1", "25OHVITD1", "25OHVITD2", "70YFVCBS4", "TESTOFREE", "TESTOSTERONE"   Endocrine Lab Results  Component Value Date   GLUCOSE 113 (H) 04/29/2017   HGBA1C 5.6 08/16/2019   TSH 36.776 (H) 08/29/2015     Neuropathy Lab Results  Component Value Date   VITAMINB12 588 01/02/2009   FOLATE 11.2 01/02/2009   HGBA1C 5.6 08/16/2019     CNS No results found for: "COLORCSF", "APPEARCSF", "RBCCOUNTCSF", "WBCCSF", "POLYSCSF", "LYMPHSCSF", "EOSCSF", "PROTEINCSF", "GLUCCSF", "JCVIRUS", "CSFOLI", "IGGCSF", "LABACHR", "ACETBL"   Inflammation (CRP: Acute  ESR: Chronic) No results  found for: "CRP", "ESRSEDRATE", "LATICACIDVEN"   Rheumatology No results found for: "RF", "ANA", "LABURIC", "URICUR", "LYMEIGGIGMAB", "LYMEABIGMQN", "HLAB27"   Coagulation Lab Results  Component Value Date   INR 1.00 04/15/2017   LABPROT 13.2 04/15/2017   APTT 28 04/29/2017   PLT 178 07/29/2017     Cardiovascular Lab Results  Component Value Date   HGB 12.6 07/29/2017    HCT 38.3 07/29/2017     Screening No results found for: "SARSCOV2NAA", "COVIDSOURCE", "STAPHAUREUS", "MRSAPCR", "HCVAB", "HIV", "PREGTESTUR"   Cancer No results found for: "CEA", "CA125", "LABCA2"   Allergens No results found for: "ALMOND", "APPLE", "ASPARAGUS", "AVOCADO", "BANANA", "BARLEY", "BASIL", "BAYLEAF", "GREENBEAN", "LIMABEAN", "WHITEBEAN", "BEEFIGE", "REDBEET", "BLUEBERRY", "BROCCOLI", "CABBAGE", "MELON", "CARROT", "CASEIN", "CASHEWNUT", "CAULIFLOWER", "CELERY"     Note: Lab results reviewed.  [place]  Drug: Ms. Harb  reports no history of drug use. Alcohol:  reports no history of alcohol use. Tobacco:  reports that she quit smoking about 16 years ago. Her smoking use included cigarettes. She has never used smokeless tobacco. Medical:  has a past medical history of Anemia, Arthritis, Depression, Hypothyroidism, Memory changes, Migraines, Silent myocardial ischemia, and Vision changes. Family: family history includes CAD in her father.  Past Surgical History:  Procedure Laterality Date   [place], 2007   COLONOSCOPY WITH PROPOFOL N/A 06/25/2017   Procedure: COLONOSCOPY WITH PROPOFOL;  Surgeon: Lucilla Lame, MD;  Location: [place];  Service: Gastroenterology;  Laterality: N/A;   ESOPHAGOGASTRODUODENOSCOPY (EGD) WITH PROPOFOL N/A 06/25/2017   Procedure: ESOPHAGOGASTRODUODENOSCOPY (EGD) WITH PROPOFOL;  Surgeon: Lucilla Lame, MD;  Location: [place];  Service: Gastroenterology;  Laterality: N/A;   POLYPECTOMY N/A 06/25/2017   Procedure: POLYPECTOMY;  Surgeon: Lucilla Lame, MD;  Location: [place];  Service: Gastroenterology;  Laterality: N/A;   Active Ambulatory Problems    Diagnosis Date Noted   Iron deficiency anemia 08/29/2015   Monoclonal gammopathy of unknown significance (MGUS) 04/15/2017   Menorrhagia with regular cycle 04/15/2017   Easy bruising 04/15/2017   Monoclonal gammopathy 04/22/2017   Positive ANA (antinuclear  antibody) 09/27/2015   Mold exposure 05/15/2016   Hypothyroidism (acquired) 05/15/2016   Goiter 05/15/2016   DOE (dyspnea on exertion) 09/27/2015   CRP elevated 09/27/2015   Chronic pain of left knee 10/14/2016   Bilateral hand numbness 10/14/2016   Arthralgia of multiple joints 09/27/2015   Benign neoplasm of transverse colon    Other diseases of stomach and duodenum    Low back pain 08/12/2018   Osteoarthritis of knee 08/12/2018   Patellofemoral stress syndrome 08/12/2018   Essential hypertension 10/16/2020   Chronic radicular lumbar pain 10/16/2020   Hereditary sensorimotor neuropathy 10/28/2022   Chronic pain syndrome 10/28/2022   Neuropathic pain of both legs 10/28/2022   Resolved Ambulatory Problems    Diagnosis Date Noted   No Resolved Ambulatory Problems   Past Medical History:  Diagnosis Date   Anemia    Arthritis    Depression    Hypothyroidism    Memory changes    Migraines    Silent myocardial ischemia    Vision changes    Constitutional Exam  General appearance: Well nourished, well developed, and well hydrated. In no apparent acute distress Vitals:   10/28/22 0936  BP: 128/85  Pulse: 91  Resp: 17  Temp: (!) 97.3 F (36.3 C)  TempSrc: Temporal  SpO2: 99%  Weight: 243 lb 11.2 oz (110.5 kg)  Height: '5\' 5"'$  (1.651 m)   BMI Assessment: Estimated body mass index  is 40.55 kg/m as calculated from the following:   Height as of this encounter: '5\' 5"'$  (1.651 m).   Weight as of this encounter: 243 lb 11.2 oz (110.5 kg).  BMI interpretation table: BMI level Category Range association with higher incidence of chronic pain  <18 kg/m2 Underweight   18.5-24.9 kg/m2 Ideal body weight   25-29.9 kg/m2 Overweight Increased incidence by 20%  30-34.9 kg/m2 Obese (Class I) Increased incidence by 68%  35-39.9 kg/m2 Severe obesity (Class II) Increased incidence by 136%  >40 kg/m2 Extreme obesity (Class III) Increased incidence by 254%   Patient's current BMI Ideal  Body weight  Body mass index is 40.55 kg/m. Ideal body weight: 57 kg (125 lb 10.6 oz) Adjusted ideal body weight: 78.4 kg (172 lb 14 oz)   BMI Readings from Last 4 Encounters:  10/28/22 40.55 kg/m  11/19/21 40.27 kg/m  04/05/18 36.22 kg/m  06/25/17 32.78 kg/m   Wt Readings from Last 4 Encounters:  10/28/22 243 lb 11.2 oz (110.5 kg)  11/19/21 242 lb (109.8 kg)  04/05/18 211 lb (95.7 kg)  06/25/17 197 lb (89.4 kg)    Psych/Mental status: Alert, oriented x 3 (person, place, & time)       Eyes: PERLA Respiratory: No evidence of acute respiratory distress  Thoracic Spine Area Exam  Skin & Axial Inspection: No masses, redness, or swelling Alignment: Symmetrical Functional ROM: Pain restricted ROM Stability: No instability detected Muscle Tone/Strength: Functionally intact. No obvious neuro-muscular anomalies detected. Sensory (Neurological): Unimpaired Muscle strength & Tone: No palpable anomalies  Lumbar Spine Area Exam  Skin & Axial Inspection: No masses, redness, or swelling Alignment: Symmetrical Functional ROM: Pain restricted ROM affecting both sides Stability: No instability detected Muscle Tone/Strength: Functionally intact. No obvious neuro-muscular anomalies detected. Sensory (Neurological): Dermatomal pain pattern Palpation: Complains of area being tender to palpation       Provocative Tests: Hyperextension/rotation test: (+) due to pain. Lumbar quadrant test (Kemp's test): (+) bilateral for foraminal stenosis Lateral bending test: (+) ipsilateral radicular pain, bilaterally. Positive for bilateral foraminal stenosis.  Gait & Posture Assessment  Ambulation: Limited Gait: Antalgic gait (limping) Posture: Difficulty standing up straight, due to pain   Lower Extremity Exam    Side: Right lower extremity  Side: Left lower extremity  Stability: No instability observed          Stability: No instability observed          Skin & Extremity Inspection: Skin color,  temperature, and hair growth are WNL. No peripheral edema or cyanosis. No masses, redness, swelling, asymmetry, or associated skin lesions. No contractures.  Skin & Extremity Inspection: Skin color, temperature, and hair growth are WNL. No peripheral edema or cyanosis. No masses, redness, swelling, asymmetry, or associated skin lesions. No contractures.  Functional ROM: Pain restricted ROM                  Functional ROM: Pain restricted ROM for all joints of the lower extremity          Muscle Tone/Strength: Functionally intact. No obvious neuro-muscular anomalies detected.  Muscle Tone/Strength: Functionally intact. No obvious neuro-muscular anomalies detected.  Sensory (Neurological): Neurogenic pain pattern        Sensory (Neurological): Neurogenic pain pattern        DTR: Patellar: deferred today Achilles: deferred today Plantar: deferred today  DTR: Patellar: deferred today Achilles: deferred today Plantar: deferred today  Palpation: No palpable anomalies  Palpation: No palpable anomalies    Assessment  Primary  Diagnosis & Pertinent Problem List: The primary encounter diagnosis was Chronic radicular lumbar pain. Diagnoses of Lumbar radiculopathy, Hereditary sensorimotor neuropathy, Neuropathic pain of both legs, and Chronic pain syndrome were also pertinent to this visit.  Visit Diagnosis (New problems to examiner): 1. Chronic radicular lumbar pain   2. Lumbar radiculopathy   3. Hereditary sensorimotor neuropathy   4. Neuropathic pain of both legs   5. Chronic pain syndrome    Plan of Care (Initial workup plan)   I discussed  percutaneous spinal cord stimulator trial with the patient in detail. I explained to the patient that they will have an external power source and programmer which the patient will use for 7 days. There will be daily communication with the stimulator company and the patient. A possible need for a mid-trial clinic visit to give the patient the best chance of  success.   Patient will need to have a thorough psychosocial behavioral evaluation. Our office will be happy to help facilitate this. Referral placed for [facility]  Some of patient's pain does seem to be mechanical in nature, with some component of neurogenic pain as well. We discussed the indications for spinal cord stimulation, specifically stating that it is typically better for neuropathic and appendicular pain, but that we have had some success in the treatment of low back and hip pain.  I was able to evaluate her interlaminar windows under live fluoroscopy and they appear patent for percutaneous SCS trial  She understands that this may not be successful, and that spinal cord stimulation in general is not a "magic bullet."   We had a lengthy and very detailed discussion of all the risks, benefits, alternatives, and rationale of surgery as well as the option of continuing nonsurgical therapies. We specifically discussed the risks of temporary or permanent worsened neurologic injury, no symptomatic relief or pain made worse after procedure, and also the need for future surgery (due to infection, CSF leak, bleeding, adjacent segment issues, bone-healing difficulties, and other related issues). No guarantees of outcome were made or implied and he is eager to proceed and presents for definitive treatment.   MAGDALINE ZOLLARS  told me that all of her questions were answered thoroughly and to hersatisfaction. Confidence and understanding of the discussed risks and consequences of  treatment was expressed and she accepted these risks and was eager to proceed with procedure pending psych eval.   Issues concerning treatment and diagnosis were discussed with the patient. There are no barriers to understanding the plan of treatment. Explanation was well received by patient and/or family who then verbalized understanding.    Imaging Orders         DG PAIN CLINIC C-ARM 1-60 MIN NO REPORT           Provider-requested follow-up: Return for Patient instructed to contact us after her psych evaluation to finalize SCS trial with [employer].  Future Appointments  Date Time Provider [place]  10/28/2022  2:00 PM Gillis Santa, MD [facility] None    Duration of encounter: 77mnutes.  Total time on encounter, as per AMA guidelines included both the face-to-face and non-face-to-face time personally spent by the physician and/or other qualified health care professional(s) on the day of the encounter (includes time in activities that require the physician or other qualified health care professional and does not include time in activities normally performed by clinical staff). Physician's time may include the following activities when performed: Preparing to see the patient (e.g., pre-charting review of records, searching  for previously ordered imaging, lab work, and nerve conduction tests) Review of prior analgesic pharmacotherapies. Reviewing PMP Interpreting ordered tests (e.g., lab work, imaging, nerve conduction tests) Performing post-procedure evaluations, including interpretation of diagnostic procedures Obtaining and/or reviewing separately obtained history Performing a medically appropriate examination and/or evaluation Counseling and educating the patient/family/caregiver Ordering medications, tests, or procedures Referring and communicating with other health care professionals (when not separately reported) Documenting clinical information in the electronic or other health record Independently interpreting results (not separately reported) and communicating results to the patient/ family/caregiver Care coordination (not separately reported)  Note by: Gillis Santa, MD Date: 10/28/2022; Time: 10:58 AM

## 2022-10-28 NOTE — Progress Notes (Signed)
Safety precautions to be maintained throughout the outpatient stay will include: orient to surroundings, keep bed in low position, maintain call bell within reach at all times, provide assistance with transfer out of bed and ambulation.  

## 2022-10-28 NOTE — Patient Instructions (Signed)
Pt instructed to notify us when she has completed psych eval at which point I can place order for SCS trial.

## 2023-02-16 ENCOUNTER — Encounter: Payer: Self-pay | Admitting: Hematology and Oncology

## 2023-08-20 ENCOUNTER — Emergency Department
Admission: EM | Admit: 2023-08-20 | Discharge: 2023-08-20 | Disposition: A | Discharge status: Home / Self Care | Payer: Medicaid Other | Attending: Emergency Medicine | Admitting: Emergency Medicine

## 2023-08-20 ENCOUNTER — Encounter: Payer: Self-pay | Admitting: Emergency Medicine

## 2023-08-20 ENCOUNTER — Emergency Department: Payer: Medicaid Other

## 2023-08-20 ENCOUNTER — Other Ambulatory Visit: Payer: Self-pay

## 2023-08-20 DIAGNOSIS — M25552 Pain in left hip: Secondary | ICD-10-CM | POA: Diagnosis present

## 2023-08-20 MED ORDER — PREDNISONE 20 MG PO TABS
60.0000 mg | ORAL_TABLET | Freq: Once | ORAL | Status: AC
  Administered 2023-08-20: 60 mg via ORAL
  Filled 2023-08-20: qty 3

## 2023-08-20 MED ORDER — MORPHINE SULFATE (PF) 4 MG/ML IV SOLN
4.0000 mg | Freq: Once | INTRAVENOUS | Status: AC
  Administered 2023-08-20: 4 mg via INTRAMUSCULAR
  Filled 2023-08-20: qty 1

## 2023-08-20 MED ORDER — OXYCODONE-ACETAMINOPHEN 5-325 MG PO TABS
1.0000 | ORAL_TABLET | ORAL | Status: DC | PRN
  Administered 2023-08-20: 1 via ORAL
  Filled 2023-08-20: qty 1

## 2023-08-20 MED ORDER — PREDNISONE 10 MG (21) PO TBPK
ORAL_TABLET | ORAL | 0 refills | Status: AC
Start: 2023-08-20 — End: ?

## 2023-08-20 MED ORDER — OXYCODONE-ACETAMINOPHEN 5-325 MG PO TABS: 1.0000 | ORAL_TABLET | Freq: Four times a day (QID) | ORAL | 0 refills | Status: AC | PRN

## 2023-08-20 NOTE — Discharge Instructions (Signed)
Please seek medical attention for any high fevers, chest pain, shortness of breath, change in behavior, persistent vomiting, bloody stool or any other new or concerning symptoms.  

## 2023-08-20 NOTE — ED Triage Notes (Signed)
Left hip pain increasing and radiating to left foot.  Does get injections to her back from neuro.  Has called for appt.  Pt is tearful in triage.

## 2023-08-20 NOTE — ED Provider Notes (Signed)
[facility] Provider Note    Event Date/Time   First MD Initiated Contact with Patient 08/20/23 1804     (approximate)   History   Hip Pain   HPI  Katherine Brennan is a 57 y.o. female who presents to the emergency department today with severe left hip pain that radiates down her leg.  The patient says that the severe pain started about 2 weeks ago.  He did seem to get better however over the past 2 to 3 days it has gotten more severe.  It does interfere with her ability to ambulate.  The patient denies any numbness in her leg.  She does state that she has a history of pain like this and has seen neurology for it in the past.  She is on Cymbalta and states she has been taking it.  She has tried some ibuprofen at home without any significant relief.  Patient denies any fevers.     Physical Exam   Triage Vital Signs: ED Triage Vitals  Encounter Vitals Group     BP 08/20/23 1618 (!) 161/113     Systolic BP Percentile --      Diastolic BP Percentile --      Pulse Rate 08/20/23 1618 99     Resp 08/20/23 1618 16     Temp 08/20/23 1618 98.7 F (37.1 C)     Temp Source 08/20/23 1618 Oral     SpO2 08/20/23 1618 96 %     Weight 08/20/23 1749 243 lb 9.7 oz (110.5 kg)     Height 08/20/23 1749 5\' 5"  (1.651 m)     Head Circumference --      Peak Flow --      Pain Score 08/20/23 1619 10     Pain Loc --      Pain Education --      Exclude from Growth Chart --     Most recent vital signs: Vitals:   08/20/23 1618  BP: (!) 161/113  Pulse: 99  Resp: 16  Temp: 98.7 F (37.1 C)  SpO2: 96%   General: Awake, alert, oriented. CV:  Good peripheral perfusion. Regular rate and rhythm. Resp:  Normal effort. Lungs clear. Abd:  No distention.  Ext:  No lower extremity edema. DP 2+ bilaterally. Tender to palpation around the left hip.    ED Results / Procedures / Treatments   Labs (all labs ordered are listed, but only abnormal results are displayed) Labs  Reviewed - No data to display   EKG  None   RADIOLOGY I independently interpreted and visualized the left hip. My interpretation: no fracture/no dislocation Radiology interpretation:  IMPRESSION:  Negative.      PROCEDURES:  Critical Care performed: N  MEDICATIONS ORDERED IN ED: Medications  oxyCODONE-acetaminophen (PERCOCET/ROXICET) 5-325 MG per tablet 1 tablet (1 tablet Oral Given 08/20/23 1621)     IMPRESSION / MDM / ASSESSMENT AND PLAN / ED COURSE  I reviewed the triage vital signs and the nursing notes.                              Differential diagnosis includes, but is not limited to, fracture, dislocation, radiculopathy, sciatica, septic joint  Patient's presentation is most consistent with acute presentation with potential threat to life or bodily function.   Patient presents to the emergency department today because of concerns for left hip pain that radiates down her leg.  Patient has had similar symptoms in the past. X-ray was obtained which did not show any significant abnormality. Patient was given pain medication here in the emergency department with some improvement. Will plan on discharging with short course of pain medication.   FINAL CLINICAL IMPRESSION(S) / ED DIAGNOSES   Final diagnoses:  Left hip pain      Note:  This document was prepared using Dragon voice recognition software and may include unintentional dictation errors.    Phineas Semen, MD 08/20/23 2051

## 2023-08-20 NOTE — ED Notes (Signed)
See triage note  Presents with pain to left hip area which radiated into lower leg  States she has had pain to her hip area the past 3 weeks  It had eased off but then returned with a vengeance  Denies any recent injury

## 2023-09-11 ENCOUNTER — Encounter: Payer: Self-pay | Admitting: *Deleted

## 2023-10-13 ENCOUNTER — Ambulatory Visit: Admission: RE | Admit: 2023-10-13 | Payer: Medicaid Other | Source: Ambulatory Visit

## 2023-10-13 ENCOUNTER — Encounter: Admission: RE | Payer: Self-pay | Source: Ambulatory Visit

## 2023-10-13 SURGERY — COLONOSCOPY WITH PROPOFOL
Anesthesia: General

## 2024-11-01 ENCOUNTER — Encounter: Payer: Self-pay | Admitting: Hematology and Oncology

## 2024-11-21 ENCOUNTER — Inpatient Hospital Stay

## 2024-11-21 ENCOUNTER — Inpatient Hospital Stay: Admitting: Internal Medicine
